# Patient Record
Sex: Female | Born: 1980 | Race: White | Hispanic: No | Marital: Married | Smoking: Never smoker
Health system: Southern US, Community
[De-identification: ages and names within clinical notes are randomized; demographics above are authoritative.]

## PROBLEM LIST (undated history)

## (undated) DIAGNOSIS — G43909 Migraine, unspecified, not intractable, without status migrainosus: Secondary | ICD-10-CM

## (undated) DIAGNOSIS — M51369 Other intervertebral disc degeneration, lumbar region without mention of lumbar back pain or lower extremity pain: Secondary | ICD-10-CM

## (undated) DIAGNOSIS — M5136 Other intervertebral disc degeneration, lumbar region: Secondary | ICD-10-CM

## (undated) DIAGNOSIS — F419 Anxiety disorder, unspecified: Secondary | ICD-10-CM

## (undated) DIAGNOSIS — F32A Depression, unspecified: Secondary | ICD-10-CM

## (undated) DIAGNOSIS — K589 Irritable bowel syndrome without diarrhea: Secondary | ICD-10-CM

## (undated) DIAGNOSIS — K219 Gastro-esophageal reflux disease without esophagitis: Secondary | ICD-10-CM

## (undated) DIAGNOSIS — G809 Cerebral palsy, unspecified: Secondary | ICD-10-CM

## (undated) DIAGNOSIS — M1990 Unspecified osteoarthritis, unspecified site: Secondary | ICD-10-CM

## (undated) DIAGNOSIS — Z8719 Personal history of other diseases of the digestive system: Secondary | ICD-10-CM

## (undated) DIAGNOSIS — G4700 Insomnia, unspecified: Secondary | ICD-10-CM

## (undated) HISTORY — DX: Other intervertebral disc degeneration, lumbar region: M51.36

## (undated) HISTORY — DX: Depression, unspecified: F32.A

## (undated) HISTORY — PX: LUMBAR FUSION: SHX111

## (undated) HISTORY — DX: Personal history of other diseases of the digestive system: Z87.19

## (undated) HISTORY — PX: BACK SURGERY: SHX140

## (undated) HISTORY — DX: Irritable bowel syndrome, unspecified: K58.9

## (undated) HISTORY — DX: Anxiety disorder, unspecified: F41.9

## (undated) HISTORY — PX: CORNEAL TRANSPLANT: SHX108

## (undated) HISTORY — DX: Unspecified osteoarthritis, unspecified site: M19.90

## (undated) HISTORY — DX: Other intervertebral disc degeneration, lumbar region without mention of lumbar back pain or lower extremity pain: M51.369

## (undated) HISTORY — DX: Gastro-esophageal reflux disease without esophagitis: K21.9

## (undated) HISTORY — DX: Migraine, unspecified, not intractable, without status migrainosus: G43.909

## (undated) HISTORY — PX: ABDOMINAL HYSTERECTOMY: SHX81

## (undated) HISTORY — DX: Insomnia, unspecified: G47.00

---

## 2018-01-07 HISTORY — PX: LAPAROSCOPIC OOPHERECTOMY: SHX6507

## 2018-10-08 HISTORY — PX: ABDOMINAL HYSTERECTOMY: SHX81

## 2019-07-11 ENCOUNTER — Emergency Department (HOSPITAL_COMMUNITY): Payer: Medicaid Other

## 2019-07-11 ENCOUNTER — Emergency Department (HOSPITAL_COMMUNITY)
Admission: EM | Admit: 2019-07-11 | Discharge: 2019-07-11 | Disposition: A | Discharge status: Home / Self Care | Payer: Medicaid Other | Attending: Emergency Medicine | Admitting: Emergency Medicine

## 2019-07-11 ENCOUNTER — Encounter (HOSPITAL_COMMUNITY): Payer: Self-pay | Admitting: Emergency Medicine

## 2019-07-11 DIAGNOSIS — M545 Low back pain: Secondary | ICD-10-CM | POA: Diagnosis present

## 2019-07-11 DIAGNOSIS — F17210 Nicotine dependence, cigarettes, uncomplicated: Secondary | ICD-10-CM | POA: Diagnosis not present

## 2019-07-11 DIAGNOSIS — M5136 Other intervertebral disc degeneration, lumbar region: Secondary | ICD-10-CM | POA: Insufficient documentation

## 2019-07-11 HISTORY — DX: Cerebral palsy, unspecified: G80.9

## 2019-07-11 MED ORDER — HYDROCODONE-ACETAMINOPHEN 5-325 MG PO TABS: 1.0000 | ORAL_TABLET | Freq: Four times a day (QID) | ORAL | 0 refills | Status: DC | PRN

## 2019-07-11 MED ORDER — HYDROCODONE-ACETAMINOPHEN 5-325 MG PO TABS
1.0000 | ORAL_TABLET | Freq: Once | ORAL | Status: AC
  Administered 2019-07-11: 1 via ORAL
  Filled 2019-07-11: qty 1

## 2019-07-11 MED ORDER — METHOCARBAMOL 500 MG PO TABS: 500.0000 mg | ORAL_TABLET | Freq: Two times a day (BID) | ORAL | 0 refills | Status: DC

## 2019-07-11 NOTE — ED Provider Notes (Signed)
[place] EMERGENCY DEPARTMENT Provider Note   CSN: [id number] Arrival date & time: 07/11/19  1417     History No chief complaint on file.   Peggy Harris is a 39 y.o. female with a past medical history of cerebral palsy presenting to the ED with a chief complaint of back pain.  States that yesterday she had a fall after feeling like her right leg became numb and "gave out on me."  States that she fell onto her extremities.  She has been having worsening back pain since then.  She remains ambulatory.  She does have a history of spinal fusion in her lumbar spine about 1-1/2 years ago and another similar surgery back in 2007.  She recently moved to [facility], is not currently in a pain management program.  She has been taking ibuprofen with only minimal improvement in her pain.  Denies any dysuria, hematuria, fever, chest pain, abdominal pain, loss of bowel or bladder function, history of cancer, history of IV drug use.  HPI     Past Medical History:  Diagnosis Date  . Cerebral palsy ([place])     There are no problems to display for this patient.   Past Surgical History:  Procedure Laterality Date  . ABDOMINAL HYSTERECTOMY    . BACK SURGERY    . CESAREAN SECTION    . CORNEAL TRANSPLANT       OB History   No obstetric history on file.     No family history on file.  Social History   Tobacco Use  . Smoking status: Current Every Day Smoker  . Smokeless tobacco: Never Used  Vaping Use  . Vaping Use: Every day  Substance Use Topics  . Alcohol use: Yes  . Drug use: Never    Home Medications Prior to Admission medications   Medication Sig Start Date End Date Taking? Authorizing Provider  HYDROcodone-acetaminophen (NORCO/VICODIN) 5-325 MG tablet Take 1 tablet by mouth every 6 (six) hours as needed. 07/11/19   Caliber Landess, PA-C  methocarbamol (ROBAXIN) 500 MG tablet Take 1 tablet (500 mg total) by mouth 2 (two) times daily. 07/11/19   Delia Heady, PA-C    Allergies    Patient has no allergy information on record.  Review of Systems   Review of Systems  Constitutional: Negative for appetite change, chills and fever.  HENT: Negative for ear pain, rhinorrhea, sneezing and sore throat.   Eyes: Negative for photophobia and visual disturbance.  Respiratory: Negative for cough, chest tightness, shortness of breath and wheezing.   Cardiovascular: Negative for chest pain and palpitations.  Gastrointestinal: Negative for abdominal pain, blood in stool, constipation, diarrhea, nausea and vomiting.  Genitourinary: Negative for dysuria, hematuria and urgency.  Musculoskeletal: Positive for back pain. Negative for myalgias.  Skin: Negative for rash.  Neurological: Negative for dizziness, weakness and light-headedness.    Physical Exam Updated Vital Signs BP 118/61 (BP Location: Right Arm)   Pulse 96   Temp 98.6 F (37 C) (Oral)   Resp 14   Ht 5\' 10"  (1.778 m)   Wt 63.5 kg   SpO2 99%   BMI 20.09 kg/m   Physical Exam Vitals and nursing note reviewed.  Constitutional:      General: She is not in acute distress.    Appearance: She is well-developed.  HENT:     Head: Normocephalic and atraumatic.     Nose: Nose normal.  Eyes:     General: No scleral icterus.  Right eye: No discharge.        Left eye: No discharge.     Conjunctiva/sclera: Conjunctivae normal.  Cardiovascular:     Rate and Rhythm: Normal rate and regular rhythm.     Heart sounds: Normal heart sounds. No murmur heard.  No friction rub. No gallop.   Pulmonary:     Effort: Pulmonary effort is normal. No respiratory distress.     Breath sounds: Normal breath sounds.  Abdominal:     General: Bowel sounds are normal. There is no distension.     Palpations: Abdomen is soft.     Tenderness: There is no abdominal tenderness. There is no guarding.  Musculoskeletal:        General: Normal range of motion.     Cervical back: Normal range of motion and neck  supple.     Comments: Tenderness palpation of the lumbar spine at the midline paraspinal musculature.  No midline spinal tenderness present in lumbar, thoracic or cervical spine. No step-off palpated. No visible bruising, edema or temperature change noted. No objective signs of numbness present. No saddle anesthesia. 2+ DP pulses bilaterally. Sensation intact to light touch.  Slightly decreased strength of the right lower extremity.  Skin:    General: Skin is warm and dry.     Findings: No rash.  Neurological:     Mental Status: She is alert.     Motor: No abnormal muscle tone.     Coordination: Coordination normal.     ED Results / Procedures / Treatments   Labs (all labs ordered are listed, but only abnormal results are displayed) Labs Reviewed - No data to display  EKG None  Radiology MR LUMBAR SPINE WO CONTRAST  Result Date: 07/11/2019 CLINICAL DATA:  Low back pain, right leg numbness and weakness, history of spinal fusion. EXAM: MRI LUMBAR SPINE WITHOUT CONTRAST TECHNIQUE: Multiplanar, multisequence MR imaging of the lumbar spine was performed. No intravenous contrast was administered. COMPARISON:  No pertinent prior studies available for comparison. FINDINGS: Segmentation:  5 lumbar vertebrae are assumed. Alignment:  Trace L3-L4 grade 1 retrolisthesis. Vertebrae: Sequela of prior multilevel posterior decompression with posterior spinal fusion construct spanning L4-S1. Susceptibility artifact from spinal fusion hardware limits evaluation of marrow signal. Within this limitation, no significant marrow edema or suspicious osseous lesion is identified. Conus medullaris and cauda equina: Conus extends to the L2 level. No signal abnormality within the visualized distal spinal cord. Paraspinal and other soft tissues: No abnormality identified within included portions of the abdomen/retroperitoneum. Postsurgical changes to the lower lumbar dorsal soft tissues. Disc levels: Mild disc height loss  at T11-T12 and L3-L4. T12-L1: This level is imaged sagittally. No significant disc herniation or stenosis. L1-L2: No significant disc herniation or stenosis. L2-L3: No significant disc herniation or stenosis. L3-L4: Mild grade 1 retrolisthesis. Small disc bulge. Mild facet arthrosis/ligamentum flavum hypertrophy. Mild bilateral subarticular narrowing without frank nerve root impingement. Central canal patent. Mild right neural foraminal narrowing. L4-L5: Sequela of prior posterior decompression and posterior spinal fusion. Susceptibility artifact from spinal fusion hardware significantly limits evaluation of the ventral spinal canal. Disc osteophyte ridge without definite disc herniation. Posterior element hypertrophy. No appreciable high-grade spinal canal stenosis. Susceptibility artifact also limits evaluation of the neural foramina. There is suspected at least mild/moderate left neural foraminal narrowing at this level (series 2, image 11). L5-S1: Sequela of previous posterior decompression and posterior spinal fusion. No stenosis. IMPRESSION: Sequela of prior multilevel posterior decompression with posterior spinal fusion construct spanning the  L4-S1 levels. Lumbar spondylosis as outlined and most notably as follows. At L3-L4, there is trace grade 1 retrolisthesis. Mild disc height loss with a small disc bulge. Mild facet arthrosis/ligamentum flavum hypertrophy. Mild bilateral subarticular narrowing without frank nerve root impingement. Central canal patent. Mild right neural foraminal narrowing. At L4-L5, there is sequela of prior posterior decompression and posterior spinal fusion. Susceptibility artifact from spinal fusion hardware significantly limits evaluation of the ventral spinal canal. Disc osteophyte ridge without definite disc herniation. Posterior element hypertrophy. No appreciable high-grade spinal canal stenosis. Susceptibility artifact also limits evaluation of the neural foramina. There is  suspected at least mild/moderate left neural foraminal narrowing at this level. Electronically Signed   By: Kellie Simmering DO   On: 07/11/2019 17:59    Procedures Procedures (including critical care time)  Medications Ordered in ED Medications  HYDROcodone-acetaminophen (NORCO/VICODIN) 5-325 MG per tablet 1 tablet (1 tablet Oral Given 07/11/19 1742)    ED Course  I have reviewed the triage vital signs and the nursing notes.  Pertinent labs & imaging results that were available during my care of the patient were reviewed by me and considered in my medical decision making (see chart for details).    MDM Rules/Calculators/A&P                          39 year old female with past medical history of cerebral palsy presenting to the ED with a chief complaint of back pain.  Had a fall after feeling like her right leg became numb and "gave out on me" yesterday.  She has been having worsening back pain since then.  Reports history of spinal fusion 2, most recent being 1-1/2 years ago.  Patient recently moved to [place] from [place], has not yet established with a specialist, primary care provider or pain management clinic.  She has been taking ibuprofen with only minimal improvement in her symptoms.  Denies any fever, hematuria, dysuria, loss of bowel or bladder function, history of cancer or history of IV drug use.  On exam patient has tenderness palpation of the lower lumbar spine at the midline paraspinal musculature.  She has some decreased strength of the right lower extremity she reports secondary to pain.  Normal sensation noted.  No objective signs of numbness.  She has no abdominal tenderness.  She is afebrile without recent use of antipyretics.  MRI obtained here of the lumbar spine without contrast showing no acute findings, no evidence of cauda equina or infectious process.  Patient's pain controlled here in the ED she remains ambulatory.  Suspect her symptoms are due to her chronic  impingement and degenerative changes.  We will have her follow-up with a spine specialist, establish care with a PCP.  Will give short course of pain medication and muscle relaxer.  I have placed a TOC consult.   Patient is hemodynamically stable, in NAD, and able to ambulate in the ED. Evaluation does not show pathology that would require ongoing emergent intervention or inpatient treatment. I explained the diagnosis to the patient. Pain has been managed and has no complaints prior to discharge. Patient is comfortable with above plan and is stable for discharge at this time. All questions were answered prior to disposition. Strict return precautions for returning to the ED were discussed. Encouraged follow up with PCP.   Prior to providing a prescription for a controlled substance, I independently reviewed the patient's recent prescription history on the [place] Controlled Substance  Reporting System. The patient had no recent or regular prescriptions and was deemed appropriate for a brief, less than 3 day prescription of narcotic for acute analgesia.  An After Visit Summary was printed and given to the patient.   Portions of this note were generated with [employer]. Dictation errors may occur despite best attempts at proofreading.  Final Clinical Impression(s) / ED Diagnoses Final diagnoses:  Degenerative disc disease, lumbar    Rx / DC Orders ED Discharge Orders         Ordered    HYDROcodone-acetaminophen (NORCO/VICODIN) 5-325 MG tablet  Every 6 hours PRN     Discontinue  Reprint     07/11/19 1825    methocarbamol (ROBAXIN) 500 MG tablet  2 times daily     Discontinue  Reprint     07/11/19 1825           Delia Heady, PA-C 07/11/19 1841    Drenda Freeze, MD 07/13/19 [phone]

## 2019-07-11 NOTE — ED Triage Notes (Signed)
C/o lower back pain and R leg pain.  Reports 2 episodes of R leg being numb and "giving out" yesterday causing her to fall.  Denies numbness at present.

## 2019-07-11 NOTE — ED Notes (Signed)
Ambulated pt from bed to commode, limps on right leg, painful with any movement. Is able to get self on stretcher with minimal assistance

## 2019-07-11 NOTE — Discharge Instructions (Addendum)
It is important for you to establish care with a primary care provider.  You can also see the specialist listed below. Take the medications as needed to help with your symptoms. Return to the ER for worsening pain, losing control of your bowels or bladder, chest pain, shortness of breath, additional injuries or falls.

## 2019-07-28 ENCOUNTER — Encounter: Payer: Self-pay | Admitting: Internal Medicine

## 2019-07-28 ENCOUNTER — Ambulatory Visit: Payer: Self-pay | Attending: Family Medicine | Admitting: Internal Medicine

## 2019-07-28 ENCOUNTER — Other Ambulatory Visit: Payer: Self-pay

## 2019-07-28 DIAGNOSIS — G4700 Insomnia, unspecified: Secondary | ICD-10-CM | POA: Insufficient documentation

## 2019-07-28 DIAGNOSIS — F5104 Psychophysiologic insomnia: Secondary | ICD-10-CM

## 2019-07-28 DIAGNOSIS — M545 Low back pain, unspecified: Secondary | ICD-10-CM

## 2019-07-28 DIAGNOSIS — M549 Dorsalgia, unspecified: Secondary | ICD-10-CM | POA: Insufficient documentation

## 2019-07-28 DIAGNOSIS — G8929 Other chronic pain: Secondary | ICD-10-CM

## 2019-07-28 HISTORY — DX: Insomnia, unspecified: G47.00

## 2019-07-28 MED ORDER — NAPROXEN 500 MG PO TABS
500.0000 mg | ORAL_TABLET | Freq: Two times a day (BID) | ORAL | 0 refills | Status: DC
Start: 2019-07-28 — End: 2020-04-18

## 2019-07-28 MED ORDER — GABAPENTIN 300 MG PO CAPS
ORAL_CAPSULE | ORAL | 3 refills | Status: DC
Start: 2019-07-28 — End: 2020-04-18

## 2019-07-28 NOTE — Assessment & Plan Note (Signed)
Ongoing insomnia I have asked her to change gabapentin to 300 qam and 900 qhs

## 2019-07-28 NOTE — Progress Notes (Signed)
F/u ED visit. Reviewed note from ED . Reviewed MRI performed in ED  She has had 2 back surgeries (2007 and 2019). One in [place] , the other in [place].   She continues to have back pain. She has daily back pain that dates back prior to the 2019 surgery. Back pain is located in lower and mid-back. Pain seems to radiate to leg and sometimes to right arm.   She complains of right leg giving her "more trouble". She describes some pain in right hip and right ankle. She thinks that her right ankle may even be swollen at times and it aches at night or if she is on her feet for long periods of time.  She has seen pain specialist in [place].   shx- lives with husband and 3 kids- ages 46-18  Exam.   Well-developed well-nourished female in no acute distress. HEENT exam atraumatic, normocephalic, extraocular muscles are intact. Neck is supple. No jugular venous distention no thyromegaly. Chest clear to auscultation without increased work of breathing. Cardiac exam S1 and S2 are regular. Abdominal exam active bowel sounds, soft, nontender. Extremities no edema. Neurologic exam she is alert  Gait is normal. She has midline lumbar scar. Tenderness even to palpation of dermis over the lumbar spine. She also has diffuse tenderness over righ hip and entire right leg to the ankle.  DTRs at knees and ankles are symmetric and brisk   Back pain Chronic back pain- MRI and clinical findings without urgent concern.  Will try naproxen and refer to PT.   She will establish with MD  Insomnia disorder Ongoing insomnia I have asked her to change gabapentin to 300 qam and 900 qhs

## 2019-07-28 NOTE — Assessment & Plan Note (Signed)
Chronic back pain- MRI and clinical findings without urgent concern.  Will try naproxen and refer to PT.   She will establish with MD

## 2019-08-12 ENCOUNTER — Ambulatory Visit: Payer: Self-pay

## 2019-08-24 ENCOUNTER — Ambulatory Visit: Payer: Self-pay | Admitting: Family Medicine

## 2019-09-02 ENCOUNTER — Ambulatory Visit (HOSPITAL_COMMUNITY): Payer: No Payment, Other | Admitting: Clinical

## 2019-10-04 ENCOUNTER — Ambulatory Visit (HOSPITAL_COMMUNITY): Payer: No Payment, Other | Admitting: Psychiatry

## 2020-01-07 ENCOUNTER — Encounter (HOSPITAL_COMMUNITY): Payer: Self-pay | Admitting: Emergency Medicine

## 2020-01-07 ENCOUNTER — Emergency Department (HOSPITAL_COMMUNITY)
Admission: EM | Admit: 2020-01-07 | Discharge: 2020-01-07 | Disposition: A | Discharge status: Home / Self Care | Payer: Medicaid Other | Attending: Emergency Medicine | Admitting: Emergency Medicine

## 2020-01-07 ENCOUNTER — Other Ambulatory Visit: Payer: Self-pay

## 2020-01-07 DIAGNOSIS — Z20822 Contact with and (suspected) exposure to covid-19: Secondary | ICD-10-CM | POA: Diagnosis not present

## 2020-01-07 DIAGNOSIS — F17200 Nicotine dependence, unspecified, uncomplicated: Secondary | ICD-10-CM | POA: Diagnosis not present

## 2020-01-07 DIAGNOSIS — M5416 Radiculopathy, lumbar region: Secondary | ICD-10-CM | POA: Insufficient documentation

## 2020-01-07 DIAGNOSIS — M545 Low back pain, unspecified: Secondary | ICD-10-CM | POA: Diagnosis present

## 2020-01-07 LAB — SARS CORONAVIRUS 2 (TAT 6-24 HRS): SARS Coronavirus 2: NEGATIVE

## 2020-01-07 MED ORDER — PREDNISONE 20 MG PO TABS
60.0000 mg | ORAL_TABLET | Freq: Once | ORAL | Status: AC
  Administered 2020-01-07: 60 mg via ORAL
  Filled 2020-01-07: qty 3

## 2020-01-07 MED ORDER — PREDNISONE 20 MG PO TABS
20.0000 mg | ORAL_TABLET | Freq: Two times a day (BID) | ORAL | 0 refills | Status: DC
Start: 2020-01-07 — End: 2020-04-18

## 2020-01-07 NOTE — ED Triage Notes (Signed)
Pt coming from home. Complaint of a fall approx. 10 days ago. Patient states she has had two spinal surgeries in past. Ever since falling increased pain and tingling in legs. VSS. NAD.

## 2020-01-07 NOTE — ED Provider Notes (Signed)
[facility] EMERGENCY DEPARTMENT Provider Note   CSN: [id number] Arrival date & time: 01/07/20  1146     History Chief Complaint  Patient presents with  . Fall    Peggy Harris is a 39 y.o. female.  HPI She presents for evaluation of a sensation of numbness and tingling in her right leg, present for 2 weeks since a fall.  She states that she fell because of a balance problem a couple weeks ago and injured her head.  She had mild headache for a while that improved after a day or 2.  She states that she has chronic ongoing numbness in her right leg but now it is "100 times worse."  She denies decreased sensation in the perineum, urinary incontinence, bowel incontinence, weakness or dizziness.  She has chronic lower back symptoms following surgery, and follows with a primary care physician for that.  She takes narcotics chronically, and frequently takes benzodiazepine medication, on a chronic basis also.  She states that she was exposed to Covid recently at work, and wants to have a Covid test done.  She denies shortness of breath, cough or fever.  She has not had Covid vaccines.  There are no other known modifying factors.    Past Medical History:  Diagnosis Date  . Cerebral palsy [facility])     Patient Active Problem List   Diagnosis Date Noted  . Back pain 07/28/2019  . Insomnia disorder 07/28/2019    Past Surgical History:  Procedure Laterality Date  . ABDOMINAL HYSTERECTOMY    . BACK SURGERY    . CESAREAN SECTION    . CORNEAL TRANSPLANT       OB History   No obstetric history on file.     No family history on file.  Social History   Tobacco Use  . Smoking status: Current Every Day Smoker  . Smokeless tobacco: Never Used  Vaping Use  . Vaping Use: Every day  Substance Use Topics  . Alcohol use: Yes  . Drug use: Never    Home Medications Prior to Admission medications   Medication Sig Start Date End Date Taking? Authorizing Provider   predniSONE (DELTASONE) 20 MG tablet Take 1 tablet (20 mg total) by mouth 2 (two) times daily. 01/07/20  Yes Mancel Bale, MD  gabapentin (NEURONTIN) 300 MG capsule Take 300 mg by mouth in the morning.    [provider]  gabapentin (NEURONTIN) 300 MG capsule 3 capsules at bedtime. 07/28/19   Hoy Register, MD  methocarbamol (ROBAXIN) 500 MG tablet Take 1 tablet (500 mg total) by mouth 2 (two) times daily. 07/11/19   Khatri, Hina, PA-C  naproxen (NAPROSYN) 500 MG tablet Take 1 tablet (500 mg total) by mouth 2 (two) times daily with a meal. 07/28/19   Hoy Register, MD    Allergies    Patient has no known allergies.  Review of Systems   Review of Systems  All other systems reviewed and are negative.   Physical Exam Updated Vital Signs BP (!) 124/98 (BP Location: Left Arm)   Pulse 87   Temp 98.3 F (36.8 C) (Oral)   Resp 17   Ht 5\' 7"  (1.702 m)   Wt 72.6 kg   SpO2 98%   BMI 25.06 kg/m   Physical Exam Vitals and nursing note reviewed.  Constitutional:      Appearance: She is well-developed and well-nourished.  HENT:     Head: Normocephalic and atraumatic.     Right  Ear: External ear normal.     Left Ear: External ear normal.  Eyes:     Extraocular Movements: EOM normal.     Conjunctiva/sclera: Conjunctivae normal.     Pupils: Pupils are equal, round, and reactive to light.  Neck:     Trachea: Phonation normal.  Cardiovascular:     Rate and Rhythm: Normal rate and regular rhythm.     Heart sounds: Normal heart sounds.  Pulmonary:     Effort: Pulmonary effort is normal.     Breath sounds: Normal breath sounds.  Chest:     Chest wall: No bony tenderness.  Abdominal:     Palpations: Abdomen is soft.     Tenderness: There is no abdominal tenderness.  Musculoskeletal:        General: Normal range of motion.     Cervical back: Normal range of motion and neck supple.  Skin:    General: Skin is warm, dry and intact.  Neurological:     Mental Status: She is  alert and oriented to person, place, and time.     Cranial Nerves: No cranial nerve deficit.     Sensory: No sensory deficit.     Motor: No abnormal muscle tone.     Coordination: Coordination normal.  Psychiatric:        Mood and Affect: Mood and affect normal.        Behavior: Behavior normal.        Thought Content: Thought content normal.        Judgment: Judgment normal.     ED Results / Procedures / Treatments   Labs (all labs ordered are listed, but only abnormal results are displayed) Labs Reviewed  SARS CORONAVIRUS 2 (TAT 6-24 HRS)    EKG None  Radiology No results found.  Procedures Procedures (including critical care time)  Medications Ordered in ED Medications  predniSONE (DELTASONE) tablet 60 mg (60 mg Oral Given 01/07/20 1829)    ED Course  I have reviewed the triage vital signs and the nursing notes.  Pertinent labs & imaging results that were available during my care of the patient were reviewed by me and considered in my medical decision making (see chart for details).    MDM Rules/Calculators/A&P                           Patient Vitals for the past 24 hrs:  BP Temp Temp src Pulse Resp SpO2 Height Weight  01/07/20 1753 (!) 124/98 98.3 F (36.8 C) Oral 87 17 98 % -- --  01/07/20 1454 131/82 -- -- 82 18 100 % -- --  01/07/20 1209 (!) 126/93 98.4 F (36.9 C) Oral 97 18 100 % 5\' 7"  (1.702 m) 72.6 kg    6:33 PM Reevaluation with update and discussion. After initial assessment and treatment, an updated evaluation reveals no change in clinical status, findings discussed with patient all questions answered. Daleen Bo   Medical Decision Making:  This patient is presenting for evaluation of numbness right leg, ongoing, worse since fall 2 weeks ago, which does not require a range of treatment options, and is not a complaint that involves a moderate risk of morbidity and mortality. The differential diagnoses include fracture, contusion, lumbar  radiculopathy, lumbar myelopathy. I decided to review old records, and in summary patient with ongoing chronic pain, using narcotics daily, presenting with worsening numbness of the right leg, and incidental request for Covid vaccine.  I  did not require additional historical information from anyone.   Critical Interventions-joint evaluation, medication treatment, Covid swab done, discussion with patient  After These Interventions, the Patient was reevaluated and was found stable for discharge.  Ongoing lumbar radiculopathy, without significant change in status.  Doubt lumbar myelopathy, fracture, intracranial injury.  CRITICAL CARE-no Performed by: Daleen Bo  Nursing Notes Reviewed/ Care Coordinated Applicable Imaging Reviewed Interpretation of Laboratory Data incorporated into ED treatment  The patient appears reasonably screened and/or stabilized for discharge and I doubt any other medical condition or other [facility] requiring further screening, evaluation, or treatment in the ED at this time prior to discharge.  Plan: Home Medications-continue usual; Home Treatments-rest, fluids; return here if the recommended treatment, does not improve the symptoms; Recommended follow up-PCP follow-up as needed.     Final Clinical Impression(s) / ED Diagnoses Final diagnoses:  Lumbar radiculopathy    Rx / DC Orders ED Discharge Orders         Ordered    predniSONE (DELTASONE) 20 MG tablet  2 times daily        01/07/20 1833           Daleen Bo, MD 01/07/20 [phone]

## 2020-01-07 NOTE — ED Notes (Signed)
Pt asked if EMT could roll her outside for some fresh air. Pt sitting near ED dropoff entrance.

## 2020-01-07 NOTE — Discharge Instructions (Addendum)
Follow-up with your treating physician for ongoing management of your discomfort.

## 2020-01-07 NOTE — ED Notes (Signed)
Reviewed discharge instructions with patient. Follow-up care and medications reviewed. Patient verbalized understanding. Patient A&Ox4, VSS upon discharge. 

## 2020-03-09 ENCOUNTER — Encounter: Payer: Self-pay | Admitting: Gastroenterology

## 2020-03-29 ENCOUNTER — Emergency Department (HOSPITAL_COMMUNITY)
Admission: EM | Admit: 2020-03-29 | Discharge: 2020-03-30 | Disposition: A | Discharge status: Home / Self Care | Payer: Medicaid Other | Attending: Emergency Medicine | Admitting: Emergency Medicine

## 2020-03-29 ENCOUNTER — Other Ambulatory Visit: Payer: Self-pay

## 2020-03-29 ENCOUNTER — Emergency Department (HOSPITAL_COMMUNITY): Payer: Medicaid Other

## 2020-03-29 ENCOUNTER — Encounter (HOSPITAL_COMMUNITY): Payer: Self-pay

## 2020-03-29 DIAGNOSIS — R600 Localized edema: Secondary | ICD-10-CM | POA: Insufficient documentation

## 2020-03-29 DIAGNOSIS — R0602 Shortness of breath: Secondary | ICD-10-CM | POA: Diagnosis not present

## 2020-03-29 DIAGNOSIS — M7989 Other specified soft tissue disorders: Secondary | ICD-10-CM | POA: Diagnosis not present

## 2020-03-29 DIAGNOSIS — R791 Abnormal coagulation profile: Secondary | ICD-10-CM | POA: Diagnosis not present

## 2020-03-29 DIAGNOSIS — F17200 Nicotine dependence, unspecified, uncomplicated: Secondary | ICD-10-CM | POA: Insufficient documentation

## 2020-03-29 DIAGNOSIS — M79662 Pain in left lower leg: Secondary | ICD-10-CM | POA: Diagnosis not present

## 2020-03-29 LAB — BASIC METABOLIC PANEL
Anion gap: 5 (ref 5–15)
BUN: 17 mg/dL (ref 6–20)
CO2: 26 mmol/L (ref 22–32)
Calcium: 8.9 mg/dL (ref 8.9–10.3)
Chloride: 106 mmol/L (ref 98–111)
Creatinine, Ser: 0.61 mg/dL (ref 0.44–1.00)
GFR, Estimated: >60 mL/min (ref >60)
Glucose, Bld: 122 mg/dL — ABNORMAL HIGH (ref 70–99)
Potassium: 4.4 mmol/L (ref 3.5–5.1)
Sodium: 137 mmol/L (ref 135–145)

## 2020-03-29 LAB — CBC
HCT: 38.1 % (ref 36.0–46.0)
Hemoglobin: 12.3 g/dL (ref 12.0–15.0)
MCH: 31.1 pg (ref 26.0–34.0)
MCHC: 32.3 g/dL (ref 30.0–36.0)
MCV: 96.5 fL (ref 80.0–100.0)
Platelets: 176 10*3/uL (ref 150–400)
RBC: 3.95 MIL/uL (ref 3.87–5.11)
RDW: 12.4 % (ref 11.5–15.5)
WBC: 6.5 10*3/uL (ref 4.0–10.5)
nRBC: 0 % (ref 0.0–0.2)

## 2020-03-29 LAB — D-DIMER, QUANTITATIVE: D-Dimer, Quant: 0.78 ug/mL-FEU — ABNORMAL HIGH (ref 0.00–0.50)

## 2020-03-29 LAB — TROPONIN I (HIGH SENSITIVITY)
Troponin I (High Sensitivity): 3 ng/L (ref <18)
Troponin I (High Sensitivity): 3 ng/L (ref <18)

## 2020-03-29 LAB — I-STAT BETA HCG BLOOD, ED (MC, WL, AP ONLY): I-stat hCG, quantitative: <5 m[IU]/mL (ref <5)

## 2020-03-29 LAB — BRAIN NATRIURETIC PEPTIDE: B Natriuretic Peptide: 39 pg/mL (ref 0.0–100.0)

## 2020-03-29 MED ORDER — HYDROCODONE-ACETAMINOPHEN 5-325 MG PO TABS
2.0000 | ORAL_TABLET | Freq: Once | ORAL | Status: AC
Start: 2020-03-29 — End: 2020-03-29
  Administered 2020-03-29: 2 via ORAL
  Filled 2020-03-29: qty 2

## 2020-03-29 NOTE — ED Provider Notes (Addendum)
[facility] EMERGENCY DEPARTMENT Provider Note   CSN: [id number] Arrival date & time: 03/29/20  1834     History No chief complaint on file.   Peggy Harris is a 40 y.o. female who presents with concern for 7 days of left lower extremity unilateral swelling with associated pain.  Patient was started on fluid pills by her primary care doctor, however this did not relieve her symptoms.  Family history of DVT.  Patient is not on any hormone replacement therapy, denies any recent surgical procedures, travel, prolonged immobilization, or history of malignancy.  Patient endorses several months of shortness of breath that has increased proportion to her weight gain.  Worse with activity.  She denies any chest pain, palpitations, nausea vomiting, diarrhea, fevers, chills at home.  She denies any numbness, tingling, weakness in her lower extremity, denies any saddle anesthesia, urinary incontinence or urinary retention.  I personally read the patient medical records.  She is history of cerebral palsy with multiple spinal surgeries; of note she is awaiting surgical repair for displaced spinal hardware that is now impinging on her spinal cord.  History of corneal transplant, abdominal hysterectomy.  She does have a history of cardiomyopathy in pregnancy, but has not had cardiac concern since her most recent pregnancy.    HPI     Past Medical History:  Diagnosis Date   Cerebral palsy [facility])     Patient Active Problem List   Diagnosis Date Noted   Back pain 07/28/2019   Insomnia disorder 07/28/2019    Past Surgical History:  Procedure Laterality Date   ABDOMINAL HYSTERECTOMY     BACK SURGERY     CESAREAN SECTION     CORNEAL TRANSPLANT       OB History   No obstetric history on file.     No family history on file.  Social History   Tobacco Use   Smoking status: Current Every Day Smoker   Smokeless tobacco: Never Used  Vaping Use   Vaping Use:  Every day  Substance Use Topics   Alcohol use: Yes   Drug use: Never    Home Medications Prior to Admission medications   Medication Sig Start Date End Date Taking? Authorizing Provider  gabapentin (NEURONTIN) 300 MG capsule Take 300 mg by mouth in the morning.    [provider]  gabapentin (NEURONTIN) 300 MG capsule 3 capsules at bedtime. 07/28/19   Charlott Rakes, MD  methocarbamol (ROBAXIN) 500 MG tablet Take 1 tablet (500 mg total) by mouth 2 (two) times daily. 07/11/19   Khatri, Hina, PA-C  naproxen (NAPROSYN) 500 MG tablet Take 1 tablet (500 mg total) by mouth 2 (two) times daily with a meal. 07/28/19   Charlott Rakes, MD  predniSONE (DELTASONE) 20 MG tablet Take 1 tablet (20 mg total) by mouth 2 (two) times daily. 01/07/20   Daleen Bo, MD    Allergies    Patient has no known allergies.  Review of Systems   Review of Systems  Constitutional: Negative.   HENT: Negative.   Respiratory: Positive for chest tightness and shortness of breath. Negative for cough and wheezing.   Cardiovascular: Positive for leg swelling. Negative for chest pain and palpitations.  Gastrointestinal: Negative.   Genitourinary: Negative.   Musculoskeletal: Negative.   Neurological: Negative.   Hematological: Negative.     Physical Exam Updated Vital Signs BP 111/78 (BP Location: Left Arm)    Pulse 87    Temp 98.4 F (36.9 C) (Oral)  Resp 20    SpO2 99%   Physical Exam Vitals and nursing note reviewed.  HENT:     Head: Normocephalic and atraumatic.     Nose: Nose normal.     Mouth/Throat:     Mouth: Mucous membranes are moist.     Pharynx: Oropharynx is clear. Uvula midline. No oropharyngeal exudate or posterior oropharyngeal erythema.     Tonsils: No tonsillar exudate.  Eyes:     General: Lids are normal. Vision grossly intact.        Right eye: No discharge.        Left eye: No discharge.     Extraocular Movements: Extraocular movements intact.     Conjunctiva/sclera:  Conjunctivae normal.     Pupils: Pupils are equal, round, and reactive to light.  Neck:     Trachea: Trachea and phonation normal.  Cardiovascular:     Rate and Rhythm: Normal rate and regular rhythm.     Pulses: Normal pulses.          Dorsalis pedis pulses are 2+ on the right side and 2+ on the left side.     Heart sounds: Normal heart sounds.  Pulmonary:     [place]: Pulmonary effort is normal. No tachypnea, accessory muscle usage or respiratory distress.     Breath sounds: Normal breath sounds. No decreased breath sounds, wheezing, rhonchi or rales.  Chest:     Chest wall: No lacerations, deformity, swelling, tenderness, crepitus or edema.  Abdominal:     General: Bowel sounds are normal. There is no distension.     Palpations: Abdomen is soft.     Tenderness: There is no abdominal tenderness. There is no guarding or rebound.  Musculoskeletal:        General: Tenderness present. No deformity.     Cervical back: Normal range of motion and neck supple. No rigidity or crepitus. No pain with movement or spinous process tenderness.     Right lower leg: Normal. No edema.     Left lower leg: Tenderness present. No bony tenderness. 1+ Edema present.     Comments: Unilateral left calf swelling and tenderness to palpation, tenderness palpation of the medial thigh on the left as well.  No erythema, crepitus, or skin changes in the left leg.  Lymphadenopathy:     Cervical: No cervical adenopathy.  Skin:    General: Skin is warm and dry.     Capillary Refill: Capillary refill takes less than 2 seconds.  Neurological:     General: No focal deficit present.     Mental Status: She is alert and oriented to person, place, and time. Mental status is at baseline.     Sensory: Sensation is intact.     Motor: Motor function is intact.  Psychiatric:        Mood and Affect: Mood normal.     ED Results / Procedures / Treatments   Labs (all labs ordered are listed, but only abnormal results are  displayed) Labs Reviewed  BASIC METABOLIC PANEL - Abnormal; Notable for the following components:      Result Value   Glucose, Bld 122 (*)    All other components within normal limits  CBC  D-DIMER, QUANTITATIVE  BRAIN NATRIURETIC PEPTIDE  I-STAT BETA HCG BLOOD, ED ([facility], [facility], [facility] ONLY)  TROPONIN I (HIGH SENSITIVITY)  TROPONIN I (HIGH SENSITIVITY)    EKG None EKG: normal sinus rhythm, no STEMI.  Radiology DG Chest 2 View  Result Date: 03/29/2020 CLINICAL  DATA:  Short of breath, chest pain, left lower extremity swelling for 5 days, tobacco abuse EXAM: CHEST - 2 VIEW COMPARISON:  None. FINDINGS: Frontal and lateral views of the chest demonstrate an unremarkable cardiac silhouette. No acute airspace disease, effusion, or pneumothorax. Diffuse interstitial prominence consistent with history of tobacco abuse. No acute bony abnormalities. IMPRESSION: 1. No acute intrathoracic process. Electronically Signed   By: Randa Ngo M.D.   On: 03/29/2020 19:49    Procedures Procedures   Medications Ordered in ED Medications  HYDROcodone-acetaminophen (NORCO/VICODIN) 5-325 MG per tablet 2 tablet (has no administration in time range)    ED Course  I have reviewed the triage vital signs and the nursing notes.  Pertinent labs & imaging results that were available during my care of the patient were reviewed by me and considered in my medical decision making (see chart for details).    MDM Rules/Calculators/A&P                         40 year old female presents with concern for unilateral left lower extremity swelling x1 week, not relieved by diuretics as prescribed by PCP.  Presents normal intake.  Cardiopulmonary exam is normal, abdominal exam is benign.  There is unilateral left lower extremity edema relative to the right, with associated left calf soreness palpation and tenderness palpation of the left medial thigh without skin changes to suggest infection.  Patient denies any recent  trauma.  CBC is unremarkable, BMP is unremarkable, initial troponin is negative, 3.  Patient is not pregnant.  Chest x-ray negative for acute intrathoracic abnormality.  EKG NSR no STEMI.   Will proceed with lower extremity Doppler at this time to rule out DVT.  Unfortunately patient presented the emergency department outside of vascular ultrasound business hours.  Patient will need to return to the emergency department tomorrow for Doppler study of her lower extremity.  Additionally patient's D-dimer was elevated to 0.78.  For this reason a CT angiogram was ordered to rule out PE.    Care of this patient signed out to oncoming ED provider, Quincy Carnes, PA-C at time of shift change.  All pertinent HPI, physical exam, laboratory, and imaging studies were reviewed with her prior to my departure.  I appreciate her collaboration of care of this patient.  Peggy Harris voiced understanding of her medical evaluation and treatment plan.  Each of her questions was answered to her expressed satisfaction.  Disposition pending CTA of the chest.  This chart was dictated using voice recognition software, Dragon. Despite the best efforts of this provider to proofread and correct errors, errors may still occur which can change documentation meaning.   Final Clinical Impression(s) / ED Diagnoses Final diagnoses:  None    Rx / DC Orders ED Discharge Orders    None       Emeline Darling, PA-C 03/30/20 0016    Peggy Harris, Gypsy Balsam, PA-C 03/30/20 0034    Lorelle Gibbs, DO 03/30/20 1622

## 2020-03-29 NOTE — ED Triage Notes (Signed)
Patient complains of SOB with left leg/ankle swelling x 1 week. Patient denies cp. Patient complains of SOB with exertion and at rest. Speaking complete sentences, NAD

## 2020-03-30 ENCOUNTER — Encounter (HOSPITAL_COMMUNITY): Payer: Medicaid Other

## 2020-03-30 ENCOUNTER — Ambulatory Visit (HOSPITAL_COMMUNITY)
Admission: RE | Admit: 2020-03-30 | Discharge: 2020-03-30 | Disposition: A | Discharge status: Home / Self Care | Payer: Medicaid Other | Source: Ambulatory Visit | Attending: Student | Admitting: Student

## 2020-03-30 ENCOUNTER — Other Ambulatory Visit (HOSPITAL_COMMUNITY): Payer: Self-pay | Admitting: *Deleted

## 2020-03-30 ENCOUNTER — Emergency Department (HOSPITAL_COMMUNITY): Payer: Medicaid Other

## 2020-03-30 DIAGNOSIS — M79605 Pain in left leg: Secondary | ICD-10-CM

## 2020-03-30 DIAGNOSIS — M79662 Pain in left lower leg: Secondary | ICD-10-CM | POA: Diagnosis not present

## 2020-03-30 MED ORDER — IOHEXOL 350 MG/ML SOLN
80.0000 mL | Freq: Once | INTRAVENOUS | Status: AC | PRN
  Administered 2020-03-30: 80 mL via INTRAVENOUS

## 2020-03-30 MED ORDER — ENOXAPARIN SODIUM 80 MG/0.8ML ~~LOC~~ SOLN
1.0000 mg/kg | Freq: Once | SUBCUTANEOUS | Status: AC
  Administered 2020-03-30: 72.5 mg via SUBCUTANEOUS
  Filled 2020-03-30: qty 0.72

## 2020-03-30 NOTE — Progress Notes (Signed)
Left lower extremity venous study completed.     Please see CV Proc for preliminary results.   Vonzell Schlatter, RVT

## 2020-03-30 NOTE — Discharge Instructions (Addendum)
You are seen in the emergency department today for your left leg swelling and pain, as well as your shortness of breath.   CT did not show any blood clot in your lungs.  Unfortunately were able to perform an ultrasound of your leg due to being outside of ultrasound business hours.  You have been scheduled for an ultrasound tomorrow morning of your leg to rule out blood clot.  Please see the attached information.

## 2020-03-30 NOTE — ED Provider Notes (Signed)
Assumed care from PA Sponsellar at shift change.  See prior notes for full H&P.  Briefly, 40 y.o. F here with unilateral left leg swelling.  D-dimer elevated, labs otherwise reassuring.    Plan:  CTA pending.  If negative, anticipate discharge home with orders for venous duplex in AM.  Results for orders placed or performed during the hospital encounter of 83/38/25  Basic metabolic panel  Result Value Ref Range   Sodium 137 135 - 145 mmol/L   Potassium 4.4 3.5 - 5.1 mmol/L   Chloride 106 98 - 111 mmol/L   CO2 26 22 - 32 mmol/L   Glucose, Bld 122 (H) 70 - 99 mg/dL   BUN 17 6 - 20 mg/dL   Creatinine, Ser 0.61 0.44 - 1.00 mg/dL   Calcium 8.9 8.9 - 10.3 mg/dL   GFR, Estimated >60 >60 mL/min   Anion gap 5 5 - 15  CBC  Result Value Ref Range   WBC 6.5 4.0 - 10.5 K/uL   RBC 3.95 3.87 - 5.11 MIL/uL   Hemoglobin 12.3 12.0 - 15.0 g/dL   HCT 38.1 36.0 - 46.0 %   MCV 96.5 80.0 - 100.0 fL   MCH 31.1 26.0 - 34.0 pg   MCHC 32.3 30.0 - 36.0 g/dL   RDW 12.4 11.5 - 15.5 %   Platelets 176 150 - 400 K/uL   nRBC 0.0 0.0 - 0.2 %  D-dimer, quantitative  Result Value Ref Range   D-Dimer, Quant 0.78 (H) 0.00 - 0.50 ug/mL-FEU  Brain natriuretic peptide  Result Value Ref Range   B Natriuretic Peptide 39.0 0.0 - 100.0 pg/mL  I-Stat beta hCG blood, ED  Result Value Ref Range   I-stat hCG, quantitative <5.0 <5 mIU/mL   Comment 3          Troponin I (High Sensitivity)  Result Value Ref Range   Troponin I (High Sensitivity) 3 <18 ng/L  Troponin I (High Sensitivity)  Result Value Ref Range   Troponin I (High Sensitivity) 3 <18 ng/L   DG Chest 2 View  Result Date: 03/29/2020 CLINICAL DATA:  Short of breath, chest pain, left lower extremity swelling for 5 days, tobacco abuse EXAM: CHEST - 2 VIEW COMPARISON:  None. FINDINGS: Frontal and lateral views of the chest demonstrate an unremarkable cardiac silhouette. No acute airspace disease, effusion, or pneumothorax. Diffuse interstitial prominence  consistent with history of tobacco abuse. No acute bony abnormalities. IMPRESSION: 1. No acute intrathoracic process. Electronically Signed   By: Randa Ngo M.D.   On: 03/29/2020 19:49   CT Angio Chest PE W/Cm &/Or Wo Cm  Result Date: 03/30/2020 CLINICAL DATA:  Shortness of breath with ankle swelling EXAM: CT ANGIOGRAPHY CHEST WITH CONTRAST TECHNIQUE: Multidetector CT imaging of the chest was performed using the standard protocol during bolus administration of intravenous contrast. Multiplanar CT image reconstructions and MIPs were obtained to evaluate the vascular anatomy. CONTRAST:  46mL OMNIPAQUE IOHEXOL 350 MG/ML SOLN COMPARISON:  None. FINDINGS: Cardiovascular: There is a optimal opacification of the pulmonary arteries. There is no central,segmental, or subsegmental filling defects within the pulmonary arteries. The heart is normal in size. No pericardial effusion or thickening. No evidence right heart strain. There is normal three-vessel brachiocephalic anatomy without proximal stenosis. The thoracic aorta is normal in appearance. Mediastinum/Nodes: No hilar, mediastinal, or axillary adenopathy. Thyroid gland, trachea, and esophagus demonstrate no significant findings. Lungs/Pleura: Mild bibasilar atelectasis is seen. No pleural effusion or pneumothorax. No airspace consolidation. Upper Abdomen: No acute abnormalities  present in the visualized portions of the upper abdomen. Musculoskeletal: No chest wall abnormality. No acute or significant osseous findings. Review of the MIP images confirms the above findings. IMPRESSION: No central, segmental, or subsegmental pulmonary embolism No acute intrathoracic pathology to explain the patient's symptoms Electronically Signed   By: Prudencio Pair M.D.   On: 03/30/2020 01:40   CTA negative.  As d-dimer was elevated, will give dose of lovenox and have her return in the AM for LE doppler to exclude DVT.     Larene Pickett, PA-C 44/97/53 0051    Delora Fuel, MD 10/27/09 [phone]

## 2020-03-30 NOTE — ED Notes (Signed)
Pt verbalized understanding of vas u/s instructions, follow up and medications.

## 2020-04-17 DIAGNOSIS — K219 Gastro-esophageal reflux disease without esophagitis: Secondary | ICD-10-CM | POA: Insufficient documentation

## 2020-04-17 DIAGNOSIS — G809 Cerebral palsy, unspecified: Secondary | ICD-10-CM | POA: Insufficient documentation

## 2020-04-17 DIAGNOSIS — M5136 Other intervertebral disc degeneration, lumbar region: Secondary | ICD-10-CM | POA: Insufficient documentation

## 2020-04-17 DIAGNOSIS — G43909 Migraine, unspecified, not intractable, without status migrainosus: Secondary | ICD-10-CM | POA: Insufficient documentation

## 2020-04-17 DIAGNOSIS — F419 Anxiety disorder, unspecified: Secondary | ICD-10-CM | POA: Insufficient documentation

## 2020-04-17 DIAGNOSIS — K589 Irritable bowel syndrome without diarrhea: Secondary | ICD-10-CM | POA: Insufficient documentation

## 2020-04-17 DIAGNOSIS — G4700 Insomnia, unspecified: Secondary | ICD-10-CM | POA: Insufficient documentation

## 2020-04-17 DIAGNOSIS — F32A Depression, unspecified: Secondary | ICD-10-CM | POA: Insufficient documentation

## 2020-04-18 ENCOUNTER — Encounter: Payer: Self-pay | Admitting: Cardiology

## 2020-04-18 ENCOUNTER — Encounter: Payer: Self-pay | Admitting: *Deleted

## 2020-04-19 ENCOUNTER — Ambulatory Visit: Payer: Medicaid Other | Admitting: Cardiology

## 2020-04-20 ENCOUNTER — Ambulatory Visit: Payer: Medicaid Other | Admitting: Gastroenterology

## 2020-05-22 ENCOUNTER — Ambulatory Visit: Payer: Medicaid Other | Admitting: Cardiology

## 2020-05-25 ENCOUNTER — Encounter: Payer: Self-pay | Admitting: Gastroenterology

## 2020-05-25 ENCOUNTER — Ambulatory Visit (INDEPENDENT_AMBULATORY_CARE_PROVIDER_SITE_OTHER): Payer: Medicaid Other | Admitting: Gastroenterology

## 2020-05-25 VITALS — BP 90/60 | HR 76 | Ht 61.0 in | Wt 179.5 lb

## 2020-05-25 DIAGNOSIS — K219 Gastro-esophageal reflux disease without esophagitis: Secondary | ICD-10-CM | POA: Diagnosis not present

## 2020-05-25 DIAGNOSIS — R197 Diarrhea, unspecified: Secondary | ICD-10-CM

## 2020-05-25 DIAGNOSIS — R1032 Left lower quadrant pain: Secondary | ICD-10-CM

## 2020-05-25 MED ORDER — DICYCLOMINE HCL 10 MG PO CAPS: 10.0000 mg | ORAL_CAPSULE | Freq: Four times a day (QID) | ORAL | 2 refills | Status: DC | PRN

## 2020-05-25 NOTE — Progress Notes (Signed)
History of Present Illness: This is a 40 year old female referred by Peggy Sleeper, PA-C for the evaluation of GERD, intermittent diarrhea, intermittent nausea, intermittent LLQ pain. She has a history of GERD and IBS.  She relates a several year history of GERD.  She states she underwent an upper endoscopy in [place] about 4 years ago which may have showed an esophageal ring otherwise negative.  She has frequent problems with urgent diarrhea associated with crampy left lower quadrant pain pressure and bloating.  She notes that caffeine generally exacerbates her diarrhea and abdominal pain so she minimizes it.  She has not noted any other foods or beverages that predictably exacerbate her symptoms.  She occasionally has difficulties with belching.  She was started on Nexium by her PCP recently and her reflux symptoms and nausea have substantially improved.  L4-L5 spine surgery is scheduled on June 10.  Denies weight loss, constipation, change in stool caliber, melena, hematochezia, vomiting, dysphagia, chest pain.    Allergies  Allergen Reactions  . Shellfish Allergy Rash  . Ambien [Zolpidem] Other (See Comments)    (Ambien) insomnia Heart palpitations  (Ambien) insomnia Heart palpitations    . Cymbalta [Duloxetine Hcl]   . Duloxetine Other (See Comments)    depression Cymbalta   . Paxil [Paroxetine Hcl]     Other reaction(s): Other (See Comments) (Paxil) Heart palpitations   . Remeron [Mirtazapine] Palpitations    Other reaction(s): Other (See Comments) Heart Palpitations, drowsy Heart Palpitations, drowsy   . Trazodone Palpitations   Outpatient Medications Prior to Visit  Medication Sig Dispense Refill  . amitriptyline (ELAVIL) 25 MG tablet Take 25 mg by mouth at bedtime.    . buprenorphine (BUTRANS) 15 MCG/HR Place 1 patch onto the skin once a week.    . esomeprazole (NEXIUM) 40 MG capsule Take 40 mg by mouth daily at 12 noon.    . gabapentin (NEURONTIN) 300 MG capsule  Take 300 mg by mouth 3 (three) times daily as needed for pain.    . hydrochlorothiazide (HYDRODIURIL) 25 MG tablet Take 25 mg by mouth daily.    . magnesium gluconate (MAGONATE) 500 MG tablet Take 500 mg by mouth daily.    Marland Kitchen oxyCODONE-acetaminophen (PERCOCET) 10-325 MG tablet Take 2 tablets by mouth every 8 (eight) hours as needed for pain.    Marland Kitchen sertraline (ZOLOFT) 100 MG tablet Take 100 mg by mouth daily.    . SUMAtriptan (IMITREX) 25 MG tablet Take 1 tablet by mouth as needed.    . tizanidine (ZANAFLEX) 6 MG capsule Take 6 mg by mouth 3 (three) times daily as needed for muscle spasms.    . ALPRAZolam (XANAX) 0.25 MG tablet Take 0.25 mg by mouth at bedtime as needed.    . busPIRone (BUSPAR) 5 MG tablet Take 5 mg by mouth 3 (three) times daily as needed for anxiety.    . meloxicam (MOBIC) 7.5 MG tablet Take 7.5 mg by mouth daily.    . mirtazapine (REMERON) 15 MG tablet Take 7.5 mg by mouth at bedtime. Take 1/2 tablet daily    . nystatin (MYCOSTATIN) 100000 UNIT/ML suspension Take 4 mLs by mouth 4 (four) times daily.    . predniSONE (DELTASONE) 20 MG tablet Take 20 mg by mouth 2 (two) times daily with a meal.    . promethazine-codeine (PHENERGAN WITH CODEINE) 6.25-10 MG/5ML syrup Take 5 mLs by mouth every 6 (six) hours as needed for cough.    . temazepam (RESTORIL) 15  MG capsule Take 15 mg by mouth at bedtime as needed for sleep.     No facility-administered medications prior to visit.   Past Medical History:  Diagnosis Date  . Anxiety   . Arthritis   . Cerebral palsy ([place])   . DDD (degenerative disc disease), lumbar   . Depression   . GERD (gastroesophageal reflux disease)   . IBS (irritable bowel syndrome)   . Insomnia disorder 07/28/2019  . Migraine   . Status post dilation of esophageal narrowing    Past Surgical History:  Procedure Laterality Date  . ABDOMINAL HYSTERECTOMY  10/2018  . BACK SURGERY    . CESAREAN SECTION    . CORNEAL TRANSPLANT     2009, 2010  . LAPAROSCOPIC  OOPHERECTOMY Right 2020  . LUMBAR FUSION     X2 2007 and 2019   Social History   Socioeconomic History  . Marital status: Married    Spouse name: Not on file  . Number of children: 3  . Years of education: Not on file  . Highest education level: Not on file  Occupational History  . Not on file  Tobacco Use  . Smoking status: Never Smoker  . Smokeless tobacco: Never Used  Vaping Use  . Vaping Use: Every day  Substance and Sexual Activity  . Alcohol use: Yes    Comment: 2 per day  . Drug use: Never  . Sexual activity: Not on file  Other Topics Concern  . Not on file  Social History Narrative  . Not on file   Social Determinants of Health   Financial Resource Strain: Not on file  Food Insecurity: Not on file  Transportation Needs: Not on file  Physical Activity: Not on file  Stress: Not on file  Social Connections: Not on file   Family History  Problem Relation Age of Onset  . Congestive Heart Failure Mother   . Stroke Mother   . Skin cancer Mother   . Clotting disorder Mother   . Leukemia Father   . Alzheimer's disease Father   . Breast cancer Maternal Aunt       Review of Systems: Pertinent positive and negative review of systems were noted in the above HPI section. All other review of systems were otherwise negative.   Physical Exam: General: Well developed, well nourished, no acute distress Head: Normocephalic and atraumatic Eyes: Sclerae anicteric, EOMI Ears: Normal auditory acuity Mouth: Not examined, mask on during Covid-19 pandemic Neck: Supple, no masses or thyromegaly Lungs: Clear throughout to auscultation Heart: Regular rate and rhythm; no murmurs, rubs or bruits Abdomen: Soft, mild LLQ and RUQ tenderness and non distended. No masses, hepatosplenomegaly or hernias noted. Normal Bowel sounds Rectal: Not done  Musculoskeletal: Symmetrical with no gross deformities  Skin: No lesions on visible extremities Pulses:  Normal pulses  noted Extremities: No clubbing, cyanosis, edema or deformities noted Neurological: Alert oriented x 4, grossly nonfocal Cervical Nodes:  No significant cervical adenopathy Inguinal Nodes: No significant inguinal adenopathy Psychological:  Alert and cooperative. Normal mood and affect   Assessment and Recommendations:  1. GERD.  Follow antireflux measures.  Continue Nexium 40 mg p.o. daily.  Gaviscon or Gas-X as needed gas and belching.  Unfortunately she does not recall the name of the physician or facility where her EGD was performed in [place].  If she does remember we will request records.  Follow-up with PCP for ongoing care.  2. LLQ abdominal pain, intermittent diarrhea.  Suspected IBS.  Lactose-free  diet for 7 days.  If diarrhea persists review and eliminate problematic foods in the a low FODMAP diet.  Begin dicyclomine 10 mg 4 times daily as needed. If her symptoms are controlled she is advised to return to her PCP for ongoing care.  If her symptoms are persistent she is advised to return after she recovers from her back surgery.   cc: Peggy Sleeper, PA-C [place],  [place] [id number]

## 2020-05-25 NOTE — Patient Instructions (Signed)
We have sent the following medications to your pharmacy for you to pick up at your convenience: dicyclomine.   Patient advised to avoid spicy, acidic, citrus, chocolate, mints, fruit and fruit juices.  Limit the intake of caffeine, alcohol and Soda.  Don't exercise too soon after eating.  Don't lie down within 3-4 hours of eating.  Elevate the head of your bed.  Please start a lactose free diet x 1 week, if you see no improvement in your symptoms then start a low-fodmap diet.  Thank you for choosing me and [facility].  Pricilla Riffle. Dagoberto Ligas., MD., Marval Regal

## 2020-06-16 HISTORY — PX: BACK SURGERY: SHX140

## 2020-07-12 ENCOUNTER — Emergency Department (HOSPITAL_COMMUNITY)
Admission: EM | Admit: 2020-07-12 | Discharge: 2020-07-12 | Discharge status: Against Medical Advice | Payer: Medicaid Other

## 2020-10-18 ENCOUNTER — Other Ambulatory Visit: Payer: Self-pay | Admitting: Orthopaedic Surgery

## 2020-10-19 ENCOUNTER — Other Ambulatory Visit: Payer: Self-pay

## 2020-10-19 ENCOUNTER — Encounter (HOSPITAL_BASED_OUTPATIENT_CLINIC_OR_DEPARTMENT_OTHER): Payer: Self-pay | Admitting: Orthopaedic Surgery

## 2020-10-30 ENCOUNTER — Encounter (HOSPITAL_BASED_OUTPATIENT_CLINIC_OR_DEPARTMENT_OTHER): Payer: Self-pay | Admitting: Orthopaedic Surgery

## 2020-10-31 ENCOUNTER — Encounter (HOSPITAL_COMMUNITY)
Admission: RE | Disposition: A | Discharge status: Home / Self Care | Payer: Self-pay | Source: Home / Self Care | Attending: Orthopaedic Surgery

## 2020-10-31 SURGERY — REPAIR EXTENSOR TENDON WITH METATARSAL OSTEOTOMY AND OPEN REDUCTION INTERNAL FIXATION (ORIF) METATARSAL
Anesthesia: Choice | Laterality: Right

## 2020-11-01 ENCOUNTER — Encounter (HOSPITAL_COMMUNITY): Payer: Self-pay | Admitting: Orthopaedic Surgery

## 2020-11-01 NOTE — Progress Notes (Signed)
Anesthesia Chart Review: SAME DAY WORK-UP  Case: [id number] Date/Time: 11/02/20 1427   Procedure: OPEN TREATMENT OF RIGHT SECOND AND THIRD METATARSAL FRACTURE AND SURGERY AS INDICATED (Right)   Anesthesia type: Choice   Pre-op diagnosis: RIGHT SECOND AND THIRD METATARSAL SHAFT FRACTURES   Location: [facility] OR ROOM 04 / [facility]   Surgeons: Erle Crocker, MD       DISCUSSION: Patient is a 40 year old female scheduled for the above procedure.  History includes never smoker, cerebral palsy, IBS, GERD, anxiety, migraines, insomnia, hysterectomy (10/2018), spinal surgery (L5-S1 fusion ~ 2007; hardware removal L5-S1, posterolateral fusion L4-S1 10/07/17; L4-L5 PSF and revision L4-5 TLIF 06/16/20), corneal transplant.   She moved to [place] from [place] ~ 2021.  Reviewed several records in [place]. She did tolerate recent back surgery at [facility] in June. PAT phone RN has not yet spoken with patient for preoperative interview. She is a same day work-up, so labs as indicated and anesthesia team to evaluate on the day of surgery.     VS: Ht 5\' 1"  (1.549 m)   Wt 81.6 kg   BMI 33.99 kg/m  BP Readings from Last 3 Encounters:  07/12/20 124/72  05/25/20 90/60  03/30/20 114/76   Pulse Readings from Last 3 Encounters:  07/12/20 (!) 106  05/25/20 76  03/30/20 74     PROVIDERS: Terald Sleeper, PA-C is PCP  Lucio Edward, MD is GI   LABS: For day of surgery. As of 03/29/20, Cr 0.61, CBC normal.    IMAGES: CTA Chest 03/30/20: IMPRESSION: No central, segmental, or subsegmental pulmonary embolism No acute intrathoracic pathology to explain the patient's symptoms   EKG: 03/29/20: Normal sinus rhythm Cannot rule out Anterior infarct , age undetermined Abnormal ECG No old tracing to compare Confirmed by Delora Fuel ([id number]) on 03/30/2020 1:15:20 AM   CV: LLE Venous US 03/30/20: Summary:  LEFT:  - There is no evidence of deep vein thrombosis in the lower extremity.  -  There is no evidence of superficial venous thrombosis.  - No cystic structure found in the popliteal fossa.      Past Medical History:  Diagnosis Date   Anxiety    Arthritis    Cerebral palsy ([place])    DDD (degenerative disc disease), lumbar    Depression    GERD (gastroesophageal reflux disease)    IBS (irritable bowel syndrome)    Insomnia disorder 07/28/2019   Migraine    Status post dilation of esophageal narrowing     Past Surgical History:  Procedure Laterality Date   ABDOMINAL HYSTERECTOMY  10/2018   BACK SURGERY     CESAREAN SECTION     CORNEAL TRANSPLANT     2009, 2010   LAPAROSCOPIC OOPHERECTOMY Right 2020   LUMBAR FUSION     X2 2007 and 2019    MEDICATIONS: No current facility-administered medications for this encounter.    carboxymethylcellul-glycerin (OPTIVE) 0.5-0.9 % ophthalmic solution   esomeprazole (NEXIUM) 40 MG capsule   famotidine (PEPCID) 40 MG tablet   gabapentin (NEURONTIN) 300 MG capsule   hydrochlorothiazide (HYDRODIURIL) 25 MG tablet   ibuprofen (ADVIL) 200 MG tablet   methocarbamol (ROBAXIN) 750 MG tablet   oxyCODONE-acetaminophen (PERCOCET) 10-325 MG tablet   promethazine (PHENERGAN) 12.5 MG tablet   rizatriptan (MAXALT-MLT) 10 MG disintegrating tablet   sertraline (ZOLOFT) 100 MG tablet    Myra Gianotti, PA-C Surgical Short Stay/Anesthesiology [facility] Phone [phone] [facility] Phone [phone] 11/01/2020 12:14 PM

## 2020-11-01 NOTE — Progress Notes (Signed)
DUE TO COVID-19 ONLY ONE VISITOR IS ALLOWED TO COME WITH YOU AND STAY IN THE WAITING ROOM ONLY DURING PRE OP AND PROCEDURE DAY OF SURGERY.   PCP - Particia Nearing, PA-C   Cardiologist - n/a Neurosurgery - Dr Atilano Ina GI - Dr Lucio Edward  Chest x-ray - 03/29/20 (2V) EKG - 03/29/20 Stress Test - n/a ECHO - n/a Cardiac Cath - n/a  ICD Pacemaker/Loop - n/a  Sleep Study -  n/a CPAP - none  Diabetes - n/a  ERAS: Clear liquids til 11;40 AM DAY OF SURGERY.  Clears reviewed with patient.  Anesthesia review: Yes  STOP now taking any Aspirin (unless otherwise instructed by your surgeon), Aleve, Naproxen, Ibuprofen, Motrin, Advil, Goody's, BC's, all herbal medications, fish oil, and all vitamins.   Coronavirus Screening Covid test n/a Ambulatory Surgery  Do you have any of the following symptoms:  Cough yes/no: No Fever (>100.85F)  yes/no: No Runny nose yes/no: No Sore throat yes/no: No Difficulty breathing/shortness of breath  yes/no: No  Have you traveled in the last 14 days and where? yes/no: No  Patient verbalized understanding of instructions that were given via phone.

## 2020-11-01 NOTE — Anesthesia Preprocedure Evaluation (Addendum)
Anesthesia Evaluation  Patient identified by MRN, date of birth, ID band Patient awake    Reviewed: Allergy & Precautions, H&P , NPO status , Patient's Chart, lab work & pertinent test results  Airway Mallampati: II   Neck ROM: full    Dental   Pulmonary Patient abstained from smoking.,    breath sounds clear to auscultation       Cardiovascular negative cardio ROS   Rhythm:regular Rate:Normal     Neuro/Psych  Headaches, PSYCHIATRIC DISORDERS Anxiety Depression Cerebral palsy    GI/Hepatic GERD  ,  Endo/Other    Renal/GU      Musculoskeletal  (+) Arthritis ,   Abdominal   Peds  Hematology   Anesthesia Other Findings   Reproductive/Obstetrics                             Anesthesia Physical Anesthesia Plan  ASA: 3  Anesthesia Plan: MAC and Regional   Post-op Pain Management:    Induction: Intravenous  PONV Risk Score and Plan: 2 and Ondansetron, Propofol infusion, Treatment may vary due to age or medical condition and Midazolam  Airway Management Planned: Simple Face Mask  Additional Equipment:   Intra-op Plan:   Post-operative Plan:   Informed Consent: I have reviewed the patients History and Physical, chart, labs and discussed the procedure including the risks, benefits and alternatives for the proposed anesthesia with the patient or authorized representative who has indicated his/her understanding and acceptance.     Dental advisory given  Plan Discussed with: CRNA, Anesthesiologist and Surgeon  Anesthesia Plan Comments: (PAT note written 11/01/2020 by Myra Gianotti, PA-C. )       Anesthesia Quick Evaluation

## 2020-11-02 ENCOUNTER — Ambulatory Visit (HOSPITAL_COMMUNITY): Payer: Medicaid Other | Admitting: Vascular Surgery

## 2020-11-02 ENCOUNTER — Ambulatory Visit (HOSPITAL_COMMUNITY)
Admission: RE | Admit: 2020-11-02 | Discharge: 2020-11-02 | Disposition: A | Discharge status: Home / Self Care | Payer: Medicaid Other | Attending: Orthopaedic Surgery | Admitting: Orthopaedic Surgery

## 2020-11-02 ENCOUNTER — Ambulatory Visit (HOSPITAL_COMMUNITY): Payer: Medicaid Other

## 2020-11-02 ENCOUNTER — Other Ambulatory Visit: Payer: Self-pay

## 2020-11-02 ENCOUNTER — Encounter (HOSPITAL_COMMUNITY)
Admission: RE | Disposition: A | Discharge status: Home / Self Care | Payer: Self-pay | Source: Home / Self Care | Attending: Orthopaedic Surgery

## 2020-11-02 DIAGNOSIS — S92321A Displaced fracture of second metatarsal bone, right foot, initial encounter for closed fracture: Secondary | ICD-10-CM | POA: Insufficient documentation

## 2020-11-02 DIAGNOSIS — S92331A Displaced fracture of third metatarsal bone, right foot, initial encounter for closed fracture: Secondary | ICD-10-CM | POA: Insufficient documentation

## 2020-11-02 DIAGNOSIS — X509XXA Other and unspecified overexertion or strenuous movements or postures, initial encounter: Secondary | ICD-10-CM | POA: Diagnosis not present

## 2020-11-02 DIAGNOSIS — Z419 Encounter for procedure for purposes other than remedying health state, unspecified: Secondary | ICD-10-CM

## 2020-11-02 DIAGNOSIS — F17290 Nicotine dependence, other tobacco product, uncomplicated: Secondary | ICD-10-CM | POA: Insufficient documentation

## 2020-11-02 HISTORY — PX: REPAIR EXTENSOR TENDON WITH METATARSAL OSTEOTOMY AND OPEN REDUCTION IN: SHX5698

## 2020-11-02 LAB — CBC
HCT: 38.9 % (ref 36.0–46.0)
Hemoglobin: 11.9 g/dL — ABNORMAL LOW (ref 12.0–15.0)
MCH: 24.9 pg — ABNORMAL LOW (ref 26.0–34.0)
MCHC: 30.6 g/dL (ref 30.0–36.0)
MCV: 81.6 fL (ref 80.0–100.0)
Platelets: 328 10*3/uL (ref 150–400)
RBC: 4.77 MIL/uL (ref 3.87–5.11)
RDW: 18.2 % — ABNORMAL HIGH (ref 11.5–15.5)
WBC: 6 10*3/uL (ref 4.0–10.5)
nRBC: 0 % (ref 0.0–0.2)

## 2020-11-02 SURGERY — REPAIR EXTENSOR TENDON WITH METATARSAL OSTEOTOMY AND OPEN REDUCTION INTERNAL FIXATION (ORIF) METATARSAL
Anesthesia: Monitor Anesthesia Care | Laterality: Right

## 2020-11-02 MED ORDER — CHLORHEXIDINE GLUCONATE 0.12 % MT SOLN: 15.0000 mL | Freq: Once | OROMUCOSAL | Status: AC

## 2020-11-02 MED ORDER — FENTANYL CITRATE (PF) 100 MCG/2ML IJ SOLN: 100.0000 ug | Freq: Once | INTRAMUSCULAR | Status: DC

## 2020-11-02 MED ORDER — OXYCODONE HCL 5 MG PO TABS: 5.0000 mg | ORAL_TABLET | ORAL | 0 refills | Status: AC | PRN

## 2020-11-02 MED ORDER — LACTATED RINGERS IV SOLN: INTRAVENOUS | Status: DC

## 2020-11-02 MED ORDER — OXYCODONE HCL 5 MG/5ML PO SOLN
5.0000 mg | Freq: Once | ORAL | Status: DC | PRN
Start: 2020-11-02 — End: 2020-11-02

## 2020-11-02 MED ORDER — CHLORHEXIDINE GLUCONATE 0.12 % MT SOLN
OROMUCOSAL | Status: AC
  Administered 2020-11-02: 15 mL via OROMUCOSAL
  Filled 2020-11-02: qty 15

## 2020-11-02 MED ORDER — FENTANYL CITRATE (PF) 100 MCG/2ML IJ SOLN: 25.0000 ug | INTRAMUSCULAR | Status: DC | PRN

## 2020-11-02 MED ORDER — CEFAZOLIN SODIUM-DEXTROSE 2-4 GM/100ML-% IV SOLN
INTRAVENOUS | Status: AC
  Filled 2020-11-02: qty 100

## 2020-11-02 MED ORDER — FENTANYL CITRATE (PF) 100 MCG/2ML IJ SOLN
INTRAMUSCULAR | Status: AC
  Administered 2020-11-02: 50 ug
  Filled 2020-11-02: qty 2

## 2020-11-02 MED ORDER — ROPIVACAINE HCL 5 MG/ML IJ SOLN
INTRAMUSCULAR | Status: DC | PRN
  Administered 2020-11-02: 30 mL via PERINEURAL

## 2020-11-02 MED ORDER — MIDAZOLAM HCL 2 MG/2ML IJ SOLN: 2.0000 mg | Freq: Once | INTRAMUSCULAR | Status: DC

## 2020-11-02 MED ORDER — 0.9 % SODIUM CHLORIDE (POUR BTL) OPTIME
TOPICAL | Status: DC | PRN
  Administered 2020-11-02: 1000 mL

## 2020-11-02 MED ORDER — MIDAZOLAM HCL 5 MG/5ML IJ SOLN
INTRAMUSCULAR | Status: DC | PRN
  Administered 2020-11-02: 2 mg via INTRAVENOUS

## 2020-11-02 MED ORDER — ORAL CARE MOUTH RINSE: 15.0000 mL | Freq: Once | OROMUCOSAL | Status: AC

## 2020-11-02 MED ORDER — LACTATED RINGERS IV SOLN: INTRAVENOUS | Status: DC | PRN

## 2020-11-02 MED ORDER — ONDANSETRON HCL 4 MG/2ML IJ SOLN
INTRAMUSCULAR | Status: DC | PRN
  Administered 2020-11-02: 4 mg via INTRAVENOUS

## 2020-11-02 MED ORDER — ONDANSETRON HCL 4 MG/2ML IJ SOLN: 4.0000 mg | Freq: Four times a day (QID) | INTRAMUSCULAR | Status: DC | PRN

## 2020-11-02 MED ORDER — MIDAZOLAM HCL 2 MG/2ML IJ SOLN
INTRAMUSCULAR | Status: AC
  Filled 2020-11-02: qty 2

## 2020-11-02 MED ORDER — CEFAZOLIN SODIUM-DEXTROSE 2-4 GM/100ML-% IV SOLN
2.0000 g | INTRAVENOUS | Status: AC
Start: 2020-11-03 — End: 2020-11-02
  Administered 2020-11-02: 2 g via INTRAVENOUS

## 2020-11-02 MED ORDER — OXYCODONE HCL 5 MG PO TABS: 5.0000 mg | ORAL_TABLET | Freq: Once | ORAL | Status: DC | PRN

## 2020-11-02 MED ORDER — MIDAZOLAM HCL 2 MG/2ML IJ SOLN
INTRAMUSCULAR | Status: AC
  Administered 2020-11-02: 1 mg
  Filled 2020-11-02: qty 2

## 2020-11-02 MED ORDER — PROPOFOL 500 MG/50ML IV EMUL
INTRAVENOUS | Status: DC | PRN
  Administered 2020-11-02: 150 ug/kg/min via INTRAVENOUS

## 2020-11-02 MED ORDER — PROPOFOL 10 MG/ML IV BOLUS
INTRAVENOUS | Status: DC | PRN
  Administered 2020-11-02: 50 mg via INTRAVENOUS

## 2020-11-02 SURGICAL SUPPLY — 58 items
.062 K WIRE ×4 IMPLANT
BENZOIN TINCTURE PRP APPL 2/3 (GAUZE/BANDAGES/DRESSINGS) IMPLANT
BLADE SURG 15 STRL LF DISP TIS (BLADE) ×2 IMPLANT
BLADE SURG 15 STRL SS (BLADE) ×2
BNDG COHESIVE 4X5 TAN ST LF (GAUZE/BANDAGES/DRESSINGS) IMPLANT
BNDG ELASTIC 4X5.8 VLCR STR LF (GAUZE/BANDAGES/DRESSINGS) ×2 IMPLANT
BNDG ELASTIC 6X5.8 VLCR STR LF (GAUZE/BANDAGES/DRESSINGS) IMPLANT
BNDG ESMARK 4X9 LF (GAUZE/BANDAGES/DRESSINGS) ×2 IMPLANT
CHLORAPREP W/TINT 26 (MISCELLANEOUS) ×2 IMPLANT
COVER BACK TABLE 60X90IN (DRAPES) ×2 IMPLANT
CUFF TOURN SGL QUICK 34 (TOURNIQUET CUFF)
CUFF TRNQT CYL 34X4.125X (TOURNIQUET CUFF) IMPLANT
DRAPE C-ARMOR (DRAPES) IMPLANT
DRAPE EXTREMITY T 121X128X90 (DISPOSABLE) ×2 IMPLANT
DRAPE IMP U-DRAPE 54X76 (DRAPES) ×2 IMPLANT
DRAPE U-SHAPE 47X51 STRL (DRAPES) ×2 IMPLANT
DRSG XEROFORM 1X8 (GAUZE/BANDAGES/DRESSINGS) ×2 IMPLANT
ELECT REM PT RETURN 9FT ADLT (ELECTROSURGICAL) ×2
ELECTRODE REM PT RTRN 9FT ADLT (ELECTROSURGICAL) ×1 IMPLANT
GAUZE SPONGE 4X4 12PLY STRL (GAUZE/BANDAGES/DRESSINGS) ×2 IMPLANT
GAUZE SPONGE 4X4 12PLY STRL LF (GAUZE/BANDAGES/DRESSINGS) ×2 IMPLANT
GAUZE XEROFORM 1X8 LF (GAUZE/BANDAGES/DRESSINGS) IMPLANT
GLOVE SRG 8 PF TXTR STRL LF DI (GLOVE) ×1 IMPLANT
GLOVE SURG ENC TEXT LTX SZ7.5 (GLOVE) ×2 IMPLANT
GLOVE SURG UNDER POLY LF SZ8 (GLOVE) ×1
GOWN STRL REUS W/ TWL LRG LVL3 (GOWN DISPOSABLE) ×1 IMPLANT
GOWN STRL REUS W/ TWL XL LVL3 (GOWN DISPOSABLE) ×1 IMPLANT
GOWN STRL REUS W/TWL LRG LVL3 (GOWN DISPOSABLE) ×1
GOWN STRL REUS W/TWL XL LVL3 (GOWN DISPOSABLE) ×1
K-WIRE DBL TROCAR .062X4 (WIRE) ×4
KWIRE DBL TROCAR .062X4 (WIRE) ×2 IMPLANT
NDL SAFETY ECLIPSE 18X1.5 (NEEDLE) IMPLANT
NEEDLE HYPO 18GX1.5 SHARP (NEEDLE)
NS IRRIG 1000ML POUR BTL (IV SOLUTION) ×2 IMPLANT
PACK BASIN DAY SURGERY FS (CUSTOM PROCEDURE TRAY) ×2 IMPLANT
PAD CAST 4YDX4 CTTN HI CHSV (CAST SUPPLIES) ×1 IMPLANT
PADDING CAST COTTON 4X4 STRL (CAST SUPPLIES) ×1
PADDING CAST SYNTHETIC 4 (CAST SUPPLIES)
PADDING CAST SYNTHETIC 4X4 STR (CAST SUPPLIES) IMPLANT
PENCIL SMOKE EVACUATOR (MISCELLANEOUS) ×2 IMPLANT
SHEET MEDIUM DRAPE 40X70 STRL (DRAPES) ×2 IMPLANT
SLEEVE SCD COMPRESS KNEE MED (STOCKING) ×2 IMPLANT
SPLINT FIBERGLASS 4X30 (CAST SUPPLIES) IMPLANT
SPONGE T-LAP 18X18 ~~LOC~~+RFID (SPONGE) IMPLANT
STOCKINETTE 6  STRL (DRAPES) ×1
STOCKINETTE 6 STRL (DRAPES) ×1 IMPLANT
STRIP CLOSURE SKIN 1/2X4 (GAUZE/BANDAGES/DRESSINGS) IMPLANT
SUCTION FRAZIER HANDLE 10FR (MISCELLANEOUS) ×1
SUCTION TUBE FRAZIER 10FR DISP (MISCELLANEOUS) ×1 IMPLANT
SUT ETHILON 3 0 PS 1 (SUTURE) ×2 IMPLANT
SUT FIBERWIRE 2-0 18 17.9 3/8 (SUTURE)
SUT MNCRL AB 3-0 PS2 18 (SUTURE) ×2 IMPLANT
SUT PDS AB 2-0 CT2 27 (SUTURE) IMPLANT
SUT VIC AB 3-0 FS2 27 (SUTURE) IMPLANT
SUTURE FIBERWR 2-0 18 17.9 3/8 (SUTURE) IMPLANT
SYR 10ML LL (SYRINGE) IMPLANT
TOWEL GREEN STERILE FF (TOWEL DISPOSABLE) ×2 IMPLANT
TUBE CONNECTING 20X1/4 (TUBING) ×2 IMPLANT

## 2020-11-02 NOTE — Anesthesia Procedure Notes (Signed)
Anesthesia Regional Block: Popliteal block   Pre-Anesthetic Checklist: , timeout performed,  Correct Patient, Correct Site, Correct Laterality,  Correct Procedure, Correct Position, site marked,  Risks and benefits discussed,  Surgical consent,  Pre-op evaluation,  At surgeon's request and post-op pain management  Laterality: Right  Prep: chloraprep       Needles:  Injection technique: Single-shot  Needle Type: Echogenic Stimulator Needle          Additional Needles:   Procedures:, nerve stimulator,,,,,     Nerve Stimulator or Paresthesia:  Response: plantar flexion of foot, 0.45 mA  Additional Responses:   Narrative:  Start time: 11/02/2020 2:27 PM End time: 11/02/2020 2:39 PM Injection made incrementally with aspirations every 5 mL.  Performed by: Personally  Anesthesiologist: Albertha Ghee, MD  Additional Notes: Functioning IV was confirmed and monitors were applied.  A 69mm 21ga Arrow echogenic stimulator needle was used. Sterile prep and drape,hand hygiene and sterile gloves were used.  Negative aspiration and negative test dose prior to incremental administration of local anesthetic. The patient tolerated the procedure well.  Ultrasound guidance: relevent anatomy identified, needle position confirmed, local anesthetic spread visualized around nerve(s), vascular puncture avoided.  Image printed for medical record.

## 2020-11-02 NOTE — H&P (Signed)
PREOPERATIVE H&P  Chief Complaint: Right foot pain  HPI: Peggy Harris is a 40 y.o. female who presents for preoperative history and physical with a diagnosis of right second and third metatarsal fractures, displaced.  She sustained these when she hyper extended her foot.. Symptoms are rated as moderate to severe, and have been worsening.  This is significantly impairing activities of daily living.  She has elected for surgical management.   Past Medical History:  Diagnosis Date   Anxiety    Arthritis    Cerebral palsy ([place])    walker for ambulation   DDD (degenerative disc disease), lumbar    Depression    GERD (gastroesophageal reflux disease)    IBS (irritable bowel syndrome)    Insomnia disorder 07/28/2019   Migraine    Status post dilation of esophageal narrowing    Past Surgical History:  Procedure Laterality Date   ABDOMINAL HYSTERECTOMY  10/2018   BACK SURGERY  06/16/2020   L4-5 TLIF Revision at [place]     x2 - 2006, 2014   CORNEAL TRANSPLANT     2009, 2010   LAPAROSCOPIC OOPHERECTOMY Right 2020   LUMBAR FUSION     X2 2007 and 2019 L4-L5   Social History   Socioeconomic History   Marital status: Married    Spouse name: Not on file   Number of children: 3   Years of education: Not on file   Highest education level: Not on file  Occupational History   Not on file  Tobacco Use   Smoking status: Never   Smokeless tobacco: Never  Vaping Use   Vaping Use: Every day   Substances: Nicotine, Flavoring  Substance and Sexual Activity   Alcohol use: Yes    Alcohol/week: 14.0 standard drinks    Types: 14 Standard drinks or equivalent per week    Comment: 2 per day wine/liquor   Drug use: Never   Sexual activity: Yes    Birth control/protection: Surgical    Comment: Hysterectomy  Other Topics Concern   Not on file  Social History Narrative   Not on file   Social Determinants of Health   Financial Resource Strain: Not on file   Food Insecurity: Not on file  Transportation Needs: Not on file  Physical Activity: Not on file  Stress: Not on file  Social Connections: Not on file   Family History  Problem Relation Age of Onset   Congestive Heart Failure Mother    Stroke Mother    Skin cancer Mother    Clotting disorder Mother    Leukemia Father    Alzheimer's disease Father    Breast cancer Maternal Aunt    Allergies  Allergen Reactions   Shellfish Allergy Rash   Ambien [Zolpidem] Other (See Comments)    (Ambien) insomnia Heart palpitations  (Ambien) insomnia Heart palpitations     Duloxetine Other (See Comments)    Increased depression (Cymbalta)    Paxil [Paroxetine Hcl]     Other reaction(s): Other (See Comments) (Paxil) Heart palpitations    Quetiapine Other (See Comments) and Palpitations    nightmares   Remeron [Mirtazapine] Palpitations    Other reaction(s): Other (See Comments) Heart Palpitations, drowsy Heart Palpitations, drowsy    Trazodone Palpitations   Prior to Admission medications   Medication Sig Start Date End Date Taking? Authorizing Provider  esomeprazole (NEXIUM) 40 MG capsule Take 40 mg by mouth daily as needed (indigestion/heartburn.).   Yes [provider]  famotidine (PEPCID) 40 MG tablet Take 40 mg by mouth at bedtime as needed for nausea. 10/25/20  Yes [provider]  gabapentin (NEURONTIN) 300 MG capsule Take 300-600 mg by mouth 3 (three) times daily as needed (nerve pain.).   Yes [provider]  ibuprofen (ADVIL) 200 MG tablet Take 800 mg by mouth every 8 (eight) hours as needed (for pain.).   Yes [provider]  methocarbamol (ROBAXIN) 750 MG tablet Take 750 mg by mouth 3 (three) times daily as needed for muscle spasms.   Yes [provider]  oxyCODONE-acetaminophen (PERCOCET) 10-325 MG tablet Take 1 tablet by mouth every 4 (four) hours as needed for pain.   Yes [provider]  promethazine (PHENERGAN) 12.5  MG tablet Take 12.5-25 mg by mouth 3 (three) times daily as needed for nausea/vomiting. 09/29/20  Yes [provider]  rizatriptan (MAXALT-MLT) 10 MG disintegrating tablet Take 10 mg by mouth daily as needed for migraine. 10/06/20  Yes [provider]  sertraline (ZOLOFT) 100 MG tablet Take 100 mg by mouth in the morning.   Yes [provider]  carboxymethylcellul-glycerin (OPTIVE) 0.5-0.9 % ophthalmic solution Place 1-2 drops into both eyes 3 (three) times daily as needed (dry/irritated eyes.).    [provider]  hydrochlorothiazide (HYDRODIURIL) 25 MG tablet Take 25 mg by mouth daily as needed (fluid retention).    [provider]     Positive ROS: All other systems have been reviewed and were otherwise negative with the exception of those mentioned in the HPI and as above.  Physical Exam:  Vitals:   11/02/20 1455 11/02/20 1500  BP: (!) 100/40 (!) 97/52  Pulse: 63 72  Resp: 14 19  Temp:    SpO2: 100% 100%   General: Alert, no acute distress Cardiovascular: No pedal edema Respiratory: No cyanosis, no use of accessory musculature GI: No organomegaly, abdomen is soft and non-tender Skin: No lesions in the area of chief complaint Neurologic: Sensation intact distally Psychiatric: Patient is competent for consent with normal mood and affect Lymphatic: No axillary or cervical lymphadenopathy  MUSCULOSKELETAL: Right foot demonstrates swelling.  There is dorsal prominence due to bony displacement.  No open wounds.  Ecchymosis present.  Tender to palpation.  She is able to wiggle toes.  Endorses sensation light touch dorsally.  Foot is warm and well-perfused.  Assessment: Right second third metatarsal fractures, displaced and shortened   Plan: Plan for open treatment of her right second and third metatarsal fractures.  We discussed the risks, benefits and alternatives of surgery which include but are not limited to wound healing complications,  infection, nonunion, malunion, need for further surgery, damage to surrounding structures and continued pain.  They understand there is no guarantees to an acceptable outcome.  After weighing these risks they opted to proceed with surgery.     Erle Crocker, MD    11/02/2020 3:43 PM

## 2020-11-02 NOTE — Op Note (Signed)
Peggy Harris female 40 y.o. 11/02/2020  PreOperative Diagnosis: Right second metatarsal fracture Right third metatarsal fracture  PostOperative Diagnosis: Same  PROCEDURE: Open reduction internal fixation of right second metatarsal fracture Open reduction internal fixation of right third metatarsal fracture  SURGEON: Melony Overly, MD  ASSISTANT: None  ANESTHESIA: MAC with peripheral nerve block  FINDINGS: Displaced comminuted second third metatarsal fracture  IMPLANTS: 062 K wire x2  INDICATIONS:40 y.o. female sustained the above fractures and a fall.  There were displaced, shortened and bayoneted.  Given the amount of displacement she was indicated for surgery.   Patient understood the risks, benefits and alternatives to surgery which include but are not limited to wound healing complications, infection, nonunion, malunion, need for further surgery as well as damage to surrounding structures. They also understood the potential for continued pain in that there were no guarantees of acceptable outcome After weighing these risks the patient opted to proceed with surgery.  PROCEDURE: Patient was identified in the preoperative holding area.  The right foot was marked by myself.  Consent was signed by myself and the patient.  Block was performed by anesthesia in the preoperative holding area.  Patient was taken to the operative suite and placed supine on the operative table.  MAC anesthesia was induced without difficulty. Bump was placed under the operative hip and bone foam was used.  All bony prominences were well padded.  Preoperative antibiotics were given. The extremity was prepped and draped in the usual sterile fashion and surgical timeout was performed.  Forge Esmarch tourniquet was placed about the ankle.  Incision was made overlying the area of the fractures between the second and third metatarsals.  This taken sharply down through skin and subcutaneous tissue.   Extensor tendons were identified and the sheath overlying them was incised in line with the incision.  The extensor tendons were mobilized and retracted.  The incision was carried down to the 2nd metatarsal.  We are able to identify the fracture site.  There was comminution, shortening and bayoneting of the fracture site.  There was robust callus formation around it that was removed with a rondure.  Fracture site was mobilized fully and under direct visualization we will to get it reduced into a better position.  K wire was placed within the distal fragment from proximal to distal at the bottom of the foot.  It was then run in a retrograde fashion up the shaft of the remaining metatarsal to the base of second metatarsal holding the reduction.  Fluoroscopy confirmed appropriate position of the K wire and reduction of the fracture.  We then turned our attention to the third metatarsal.  The extensor tendons were mobilized and retracted.  The incision was carried down to the 3rd metatarsal.  We are able to identify the fracture site.  There was comminution, shortening and bayoneting of the fracture site.  There was robust callus formation around it that was removed with a rondure.  Fracture site was mobilized fully and under direct visualization we will to get it reduced into a better position.  K wire was placed within the distal fragment from proximal to distal at the bottom of the foot.  It was then run in a retrograde fashion up the shaft of the remaining metatarsal to the base of second metatarsal holding the reduction.  Fluoroscopy confirmed appropriate position of the K wire and reduction of the fracture.  Final fluoroscopic images were obtained.  Wound was then irrigated and closed in  a layered fashion using 3-0 Monocryl and 3-0 nylon suture.  Tourniquet was released.  Soft dressing was placed.  She was awakened from anesthesia and taken recovery in stable condition.  POST OPERATIVE  INSTRUCTIONS: Heel weightbearing CAM Walker boot Keep dressing in place Follow-up in 2 weeks for suture removal and x-rays, nonweightbearing of the operative foot.   BLOOD LOSS:  Minimal         DRAINS: none         SPECIMEN: none       COMPLICATIONS:  * No complications entered in OR log *         Disposition: PACU - hemodynamically stable.         Condition: stable

## 2020-11-02 NOTE — Transfer of Care (Signed)
Immediate Anesthesia Transfer of Care Note  Patient: Peggy Harris  Procedure(s) Performed: OPEN TREATMENT OF RIGHT SECOND AND THIRD METATARSAL FRACTURE AND SURGERY AS INDICATED (Right)  Patient Location: PACU  Anesthesia Type:General and Regional  Level of Consciousness: awake, alert , oriented and patient cooperative  Airway & Oxygen Therapy: Patient Spontanous Breathing and Patient connected to face mask oxygen  Post-op Assessment: Report given to RN, Post -op Vital signs reviewed and stable and Patient moving all extremities X 4  Post vital signs: Reviewed and stable  Last Vitals:  Vitals Value Taken Time  BP 109/70 11/02/20 1720  Temp    Pulse 92 11/02/20 1723  Resp 16 11/02/20 1723  SpO2 100 % 11/02/20 1723  Vitals shown include unvalidated device data.  Last Pain:  Vitals:   11/02/20 1500  TempSrc:   PainSc: 0-No pain      Patients Stated Pain Goal: 3 (93/81/82 [id number])  Complications: No notable events documented.

## 2020-11-02 NOTE — Anesthesia Procedure Notes (Signed)
Procedure Name: MAC Date/Time: 11/02/2020 4:23 PM Performed by: Annamary Carolin, CRNA Pre-anesthesia Checklist: Patient identified, Emergency Drugs available, Suction available, Patient being monitored and Timeout performed Patient Re-evaluated:Patient Re-evaluated prior to induction Oxygen Delivery Method: Simple face mask Preoxygenation: Pre-oxygenation with 100% oxygen Induction Type: IV induction Dental Injury: Teeth and Oropharynx as per pre-operative assessment

## 2020-11-02 NOTE — Discharge Instructions (Signed)
DR. Raley Novicki FOOT [facility] ANKLE SURGERY POST-OP INSTRUCTIONS   Pain Management The numbing medicine and your leg will last around 18 hours, take a dose of your pain medicine as soon as you feel it wearing off to avoid rebound pain. Keep your foot elevated above heart level.  Make sure that your heel hangs free ('floats'). Take all prescribed medication as directed. If taking narcotic pain medication you may want to use an over-the-counter stool softener to avoid constipation. You may take over-the-counter NSAIDs (ibuprofen, naproxen, etc.) as well as over-the-counter acetaminophen as directed on the packaging as a supplement for your pain and may also use it to wean away from the prescription medication.  Activity Heel WB in post operative shoe. Keep dressing in place  First Postoperative Visit Your first postop visit will be at least 2 weeks after surgery.  This should be scheduled when you schedule surgery. If you do not have a postoperative visit scheduled please call [phone] to schedule an appointment. At the appointment your incision will be evaluated for suture removal, x-rays will be obtained if necessary.  General Instructions Swelling is very common after foot and ankle surgery.  It often takes 3 months for the foot and ankle to begin to feel comfortable.  Some amount of swelling will persist for 6-12 months. DO NOT change the dressing.  If there is a problem with the dressing (too tight, loose, gets wet, etc.) please contact Dr. Jamario Colina's office. DO NOT get the dressing wet.  For showers you can use an over-the-counter cast cover or wrap a washcloth around the top of your dressing and then cover it with a plastic bag and tape it to your leg. DO NOT soak the incision (no tubs, pools, bath, etc.) until you have approval from Dr. Rosalena Mccorry.  Contact Dr. Adairs office or go to Emergency Room if: Temperature above 101 F. Increasing pain that is unresponsive to pain medication or  elevation Excessive redness or swelling in your foot Dressing problems - excessive bloody drainage, looseness or tightness, or if dressing gets wet Develop pain, swelling, warmth, or discoloration of your calf  

## 2020-11-03 ENCOUNTER — Encounter (HOSPITAL_COMMUNITY): Payer: Self-pay | Admitting: Orthopaedic Surgery

## 2020-11-03 NOTE — Anesthesia Postprocedure Evaluation (Signed)
Anesthesia Post Note  Patient: Peggy Harris  Procedure(s) Performed: OPEN TREATMENT OF RIGHT SECOND AND THIRD METATARSAL FRACTURE AND SURGERY AS INDICATED (Right)     Patient location during evaluation: PACU Anesthesia Type: Regional and MAC Level of consciousness: awake and alert Pain management: pain level controlled Vital Signs Assessment: post-procedure vital signs reviewed and stable Respiratory status: spontaneous breathing, nonlabored ventilation, respiratory function stable and patient connected to nasal cannula oxygen Cardiovascular status: stable and blood pressure returned to baseline Postop Assessment: no apparent nausea or vomiting Anesthetic complications: no   No notable events documented.  Last Vitals:  Vitals:   11/02/20 1735 11/02/20 1745  BP: 110/68 103/72  Pulse: 85 93  Resp: 13 19  Temp:  36.8 C  SpO2: 98% 97%    Last Pain:  Vitals:   11/02/20 1745  TempSrc:   PainSc: 0-No pain                 Marjani Kobel S

## 2020-12-07 ENCOUNTER — Encounter: Payer: Self-pay | Admitting: Psychiatry

## 2020-12-07 ENCOUNTER — Ambulatory Visit: Payer: Medicaid Other | Admitting: Psychiatry

## 2020-12-07 NOTE — Progress Notes (Deleted)
[facility]  PATIENT: Peggy Harris DOB: 1980-12-17  REFERRING CLINICIAN: Terald Sleeper, PA-C HISTORY FROM: *** REASON FOR VISIT: memory loss, headaches   HISTORICAL  CHIEF COMPLAINT:  No chief complaint on file.   HISTORY OF PRESENT ILLNESS:  The patient presents for evaluation of memory loss and headaches which have been present since***  TBI: *** No past history of TBI Stroke: *** no past history of stroke Seizures: *** no past history of seizures Sleep: *** no history of sleep apnea.  Has *** never had sleep study.  STOP BANG score *** Mood: *** patient denies anxiety and depression  Functional status: independent in all ** ADLs and IADLs Patient lives with *** in a *** with *** stairs. Cooking: *** Cleaning: *** Shopping: *** Bathing: *** Toileting: *** Driving: *** Bills: *** Medications: *** Ever left the stove on by accident?: *** Forget how to use items around the house?: *** Getting lost going to familiar places?: *** Forgetting loved ones names?: *** Word finding difficulty? *** Sleep: ***  OTHER MEDICAL CONDITIONS: ***   REVIEW OF SYSTEMS: Full 14 system review of systems performed and negative with exception of: ***  ALLERGIES: Allergies  Allergen Reactions   Shellfish Allergy Rash   Ambien [Zolpidem] Other (See Comments)    (Ambien) insomnia Heart palpitations  (Ambien) insomnia Heart palpitations     Duloxetine Other (See Comments)    Increased depression (Cymbalta)    Paxil [Paroxetine Hcl]     Other reaction(s): Other (See Comments) (Paxil) Heart palpitations    Quetiapine Other (See Comments) and Palpitations    nightmares   Remeron [Mirtazapine] Palpitations    Other reaction(s): Other (See Comments) Heart Palpitations, drowsy Heart Palpitations, drowsy    Trazodone Palpitations    HOME MEDICATIONS: Outpatient Medications Prior to Visit  Medication Sig Dispense Refill    carboxymethylcellul-glycerin (OPTIVE) 0.5-0.9 % ophthalmic solution Place 1-2 drops into both eyes 3 (three) times daily as needed (dry/irritated eyes.).     dicyclomine (BENTYL) 10 MG capsule Take 10 mg by mouth 4 (four) times daily -  before meals and at bedtime.     esomeprazole (NEXIUM) 40 MG capsule Take 40 mg by mouth daily as needed (indigestion/heartburn.).     famotidine (PEPCID) 40 MG tablet Take 40 mg by mouth at bedtime as needed for nausea.     gabapentin (NEURONTIN) 300 MG capsule Take 300-600 mg by mouth 3 (three) times daily as needed (nerve pain.).     hydrochlorothiazide (HYDRODIURIL) 25 MG tablet Take 25 mg by mouth daily as needed (fluid retention).     hydrOXYzine (VISTARIL) 25 MG capsule Take 25 mg by mouth 3 (three) times daily as needed.     ibuprofen (ADVIL) 200 MG tablet Take 800 mg by mouth every 8 (eight) hours as needed (for pain.).     methocarbamol (ROBAXIN) 750 MG tablet Take 750 mg by mouth 3 (three) times daily as needed for muscle spasms.     oxyCODONE-acetaminophen (PERCOCET) 10-325 MG tablet Take 1 tablet by mouth every 4 (four) hours as needed for pain.     promethazine (PHENERGAN) 12.5 MG tablet Take 12.5-25 mg by mouth 3 (three) times daily as needed for nausea/vomiting.     rizatriptan (MAXALT-MLT) 10 MG disintegrating tablet Take 10 mg by mouth daily as needed for migraine.     sertraline (ZOLOFT) 100 MG tablet Take 100 mg by mouth in the morning.     No facility-administered medications prior to visit.  PAST MEDICAL HISTORY: Past Medical History:  Diagnosis Date   Anxiety    Arthritis    Cerebral palsy ([place])    walker for ambulation   DDD (degenerative disc disease), lumbar    Depression    GERD (gastroesophageal reflux disease)    IBS (irritable bowel syndrome)    Insomnia disorder 07/28/2019   Migraine    Status post dilation of esophageal narrowing     PAST SURGICAL HISTORY: Past Surgical History:  Procedure Laterality Date    ABDOMINAL HYSTERECTOMY  10/2018   BACK SURGERY  06/16/2020   L4-5 TLIF Revision at [place]     x2 - 2006, 2014   CORNEAL TRANSPLANT     2009, 2010   LAPAROSCOPIC OOPHERECTOMY Right 2020   LUMBAR FUSION     X2 2007 and 2019 L4-L5   REPAIR EXTENSOR TENDON WITH METATARSAL OSTEOTOMY AND OPEN REDUCTION IN Right 11/02/2020   Procedure: OPEN TREATMENT OF RIGHT SECOND AND THIRD METATARSAL FRACTURE AND SURGERY AS INDICATED;  Surgeon: Erle Crocker, MD;  Location: [facility];  Service: Orthopedics;  Laterality: Right;    FAMILY HISTORY: Family History  Problem Relation Age of Onset   Congestive Heart Failure Mother    Stroke Mother    Skin cancer Mother    Clotting disorder Mother    Leukemia Father    Alzheimer's disease Father    Breast cancer Maternal Aunt     SOCIAL HISTORY: Social History   Socioeconomic History   Marital status: Married    Spouse name: Not on file   Number of children: 3   Years of education: Not on file   Highest education level: Not on file  Occupational History   Not on file  Tobacco Use   Smoking status: Never   Smokeless tobacco: Never  Vaping Use   Vaping Use: Every day   Substances: Nicotine, Flavoring  Substance and Sexual Activity   Alcohol use: Yes    Alcohol/week: 14.0 standard drinks    Types: 14 Standard drinks or equivalent per week    Comment: 2 per day wine/liquor   Drug use: Never   Sexual activity: Yes    Birth control/protection: Surgical    Comment: Hysterectomy  Other Topics Concern   Not on file  Social History Narrative   Not on file   Social Determinants of Health   Financial Resource Strain: Not on file  Food Insecurity: Not on file  Transportation Needs: Not on file  Physical Activity: Not on file  Stress: Not on file  Social Connections: Not on file  Intimate Partner Violence: Not on file     PHYSICAL EXAM ***  GENERAL EXAM/CONSTITUTIONAL: Vitals: There were no vitals filed for  this visit. There is no height or weight on file to calculate BMI. Wt Readings from Last 3 Encounters:  11/02/20 175 lb (79.4 kg)  05/25/20 179 lb 8 oz (81.4 kg)  01/07/20 160 lb (72.6 kg)   Patient is in no distress; well developed, nourished and groomed; neck is supple  CARDIOVASCULAR: Examination of carotid arteries is normal; no carotid bruits Regular rate and rhythm, no murmurs Examination of peripheral vascular system by observation and palpation is normal  EYES: Pupils round and reactive to light, Visual fields full to confrontation, Extraocular movements intacts,   MUSCULOSKELETAL: Gait, strength, tone, movements noted in Neurologic exam below  NEUROLOGIC: MENTAL STATUS:  No flowsheet data found. awake, alert, oriented to person, place and time recent  and remote memory intact normal attention and concentration language fluent, comprehension intact, naming intact fund of knowledge appropriate  CRANIAL NERVE:  2nd - no papilledema or hemorrhages on fundoscopic exam 2nd, 3rd, 4th, 6th - pupils equal and reactive to light, visual fields full to confrontation, extraocular muscles intact, no nystagmus 5th - facial sensation symmetric 7th - facial strength symmetric 8th - hearing intact 9th - palate elevates symmetrically, uvula midline 11th - shoulder shrug symmetric 12th - tongue protrusion midline  MOTOR:  normal bulk and tone, no cogwheeling, full strength in the BUE, BLE  SENSORY:  normal and symmetric to light touch, pinprick, temperature, vibration  COORDINATION:  finger-nose-finger, fine finger movements normal, no tremor  REFLEXES:  deep tendon reflexes present and symmetric  GAIT/STATION:  normal     DIAGNOSTIC DATA (LABS, IMAGING, TESTING) - I reviewed patient records, labs, notes, testing and imaging myself where available.  Lab Results  Component Value Date   WBC 6.0 11/02/2020   HGB 11.9 (L) 11/02/2020   HCT 38.9 11/02/2020   MCV 81.6  11/02/2020   PLT 328 11/02/2020      Component Value Date/Time   NA 137 03/29/2020 1932   K 4.4 03/29/2020 1932   CL 106 03/29/2020 1932   CO2 26 03/29/2020 1932   GLUCOSE 122 (H) 03/29/2020 1932   BUN 17 03/29/2020 1932   CREATININE 0.61 03/29/2020 1932   CALCIUM 8.9 03/29/2020 1932   GFRNONAA >60 03/29/2020 1932   No results found for: CHOL, HDL, LDLCALC, LDLDIRECT, TRIG, CHOLHDL No results found for: HGBA1C No results found for: VITAMINB12 No results found for: TSH  ***    ASSESSMENT AND PLAN  40 y.o. year old female with ***   No diagnosis found.    PLAN: - Labs: CBC, CMP, TSH, B12, RPR  - MRI brain to assess for signs of neurodegeneration and/or significant vascular disease.  - Will place referral for neuropsychological testing to better characterize his/her reported deficits and to establish a cognitive baseline.  - Referral placed for cognitive rehabilitation to help with some of his/her issues.  - Follow up after testing is complete.   No orders of the defined types were placed in this encounter.   No orders of the defined types were placed in this encounter.   No follow-ups on file.  I spent an average of *** chart reviewing and counseling the patient, with at least 50% of the time face to face with the patient. General brain health measures discussed, including the importance of regular aerobic exercise. Reviewed safety measures including driving safety.   Genia Harold, MD  [facility] [address] [place], [place] [address] [phone]

## 2020-12-20 ENCOUNTER — Other Ambulatory Visit (HOSPITAL_COMMUNITY): Payer: Self-pay | Admitting: Orthopaedic Surgery

## 2020-12-20 ENCOUNTER — Ambulatory Visit (HOSPITAL_COMMUNITY): Admission: RE | Admit: 2020-12-20 | Payer: Medicaid Other | Source: Ambulatory Visit

## 2020-12-20 DIAGNOSIS — M79669 Pain in unspecified lower leg: Secondary | ICD-10-CM

## 2020-12-29 ENCOUNTER — Ambulatory Visit (HOSPITAL_COMMUNITY): Admission: RE | Admit: 2020-12-29 | Payer: Medicaid Other | Source: Ambulatory Visit

## 2020-12-31 ENCOUNTER — Encounter (HOSPITAL_COMMUNITY): Payer: Self-pay

## 2020-12-31 ENCOUNTER — Other Ambulatory Visit: Payer: Self-pay

## 2020-12-31 ENCOUNTER — Emergency Department (HOSPITAL_COMMUNITY): Payer: Medicaid Other

## 2020-12-31 ENCOUNTER — Emergency Department (HOSPITAL_COMMUNITY)
Admission: EM | Admit: 2020-12-31 | Discharge: 2020-12-31 | Disposition: A | Discharge status: Home / Self Care | Payer: Medicaid Other | Attending: Emergency Medicine | Admitting: Emergency Medicine

## 2020-12-31 DIAGNOSIS — L02415 Cutaneous abscess of right lower limb: Secondary | ICD-10-CM | POA: Insufficient documentation

## 2020-12-31 DIAGNOSIS — M7989 Other specified soft tissue disorders: Secondary | ICD-10-CM | POA: Diagnosis present

## 2020-12-31 DIAGNOSIS — L03115 Cellulitis of right lower limb: Secondary | ICD-10-CM | POA: Insufficient documentation

## 2020-12-31 DIAGNOSIS — L0291 Cutaneous abscess, unspecified: Secondary | ICD-10-CM

## 2020-12-31 MED ORDER — HYDROCODONE-ACETAMINOPHEN 5-325 MG PO TABS
1.0000 | ORAL_TABLET | Freq: Once | ORAL | Status: AC
  Administered 2020-12-31: 14:00:00 1 via ORAL
  Filled 2020-12-31: qty 1

## 2020-12-31 MED ORDER — IBUPROFEN 400 MG PO TABS
600.0000 mg | ORAL_TABLET | Freq: Once | ORAL | Status: AC
  Administered 2020-12-31: 14:00:00 600 mg via ORAL
  Filled 2020-12-31: qty 1

## 2020-12-31 MED ORDER — SULFAMETHOXAZOLE-TRIMETHOPRIM 800-160 MG PO TABS: 1.0000 | ORAL_TABLET | Freq: Two times a day (BID) | ORAL | 0 refills | Status: AC

## 2020-12-31 MED ORDER — LIDOCAINE-EPINEPHRINE (PF) 2 %-1:200000 IJ SOLN
10.0000 mL | Freq: Once | INTRAMUSCULAR | Status: AC
  Administered 2020-12-31: 10 mL
  Filled 2020-12-31: qty 20

## 2020-12-31 MED ORDER — SULFAMETHOXAZOLE-TRIMETHOPRIM 800-160 MG PO TABS
1.0000 | ORAL_TABLET | Freq: Once | ORAL | Status: AC
  Administered 2020-12-31: 14:00:00 1 via ORAL
  Filled 2020-12-31: qty 1

## 2020-12-31 NOTE — Discharge Instructions (Signed)
Take your normal home pain medications as needed for pain.  Return for increased redness or swelling.

## 2020-12-31 NOTE — ED Triage Notes (Signed)
Pt BIB [facility] from home c/o right foot pain. Pt noticed swelling that started 5 days ago. Pt also c/o discoloration of the right foot as well. Pt had foot surgery about a month ago. Pt's right foot is warm to touch, red and swollen.

## 2020-12-31 NOTE — ED Provider Notes (Signed)
[facility] EMERGENCY DEPARTMENT Provider Note   CSN: [id number] Arrival date & time: 12/31/20  1236     History Chief Complaint  Patient presents with   Foot Pain    Peggy Harris is a 40 y.o. female.  Pt presents to the ED today with right foot pain and swelling.  Pt had a fracture of her right 2nd and 3rd metatarsal fx which were displaced.  She had surgery on 10/27.  She had the pins removed a few weeks ago.  Pt initially said she has had redness and swelling to the right food for the past 5 days, but then said it was for 1-2 weeks  The pt is still wearing a cam walker.  No f/c.       Past Medical History:  Diagnosis Date   Anxiety    Arthritis    Cerebral palsy ([place])    walker for ambulation   DDD (degenerative disc disease), lumbar    Depression    GERD (gastroesophageal reflux disease)    IBS (irritable bowel syndrome)    Insomnia disorder 07/28/2019   Migraine    Status post dilation of esophageal narrowing     Patient Active Problem List   Diagnosis Date Noted   Migraine    Insomnia    IBS (irritable bowel syndrome)    GERD (gastroesophageal reflux disease)    Depression    DDD (degenerative disc disease), lumbar    Cerebral palsy ([place])    Anxiety    Back pain 07/28/2019   Insomnia disorder 07/28/2019    Past Surgical History:  Procedure Laterality Date   ABDOMINAL HYSTERECTOMY  10/2018   BACK SURGERY  06/16/2020   L4-5 TLIF Revision at [place]     x2 - 2006, 2014   CORNEAL TRANSPLANT     2009, 2010   LAPAROSCOPIC OOPHERECTOMY Right 2020   LUMBAR FUSION     X2 2007 and 2019 L4-L5   REPAIR EXTENSOR TENDON WITH METATARSAL OSTEOTOMY AND OPEN REDUCTION IN Right 11/02/2020   Procedure: OPEN TREATMENT OF RIGHT SECOND AND THIRD METATARSAL FRACTURE AND SURGERY AS INDICATED;  Surgeon: Erle Crocker, MD;  Location: [facility];  Service: Orthopedics;  Laterality: Right;     OB History   No obstetric  history on file.     Family History  Problem Relation Age of Onset   Congestive Heart Failure Mother    Stroke Mother    Skin cancer Mother    Clotting disorder Mother    Leukemia Father    Alzheimer's disease Father    Breast cancer Maternal Aunt     Social History   Tobacco Use   Smoking status: Never   Smokeless tobacco: Never  Vaping Use   Vaping Use: Every day   Substances: Nicotine, Flavoring  Substance Use Topics   Alcohol use: Yes    Alcohol/week: 14.0 standard drinks    Types: 14 Standard drinks or equivalent per week    Comment: 2 per day wine/liquor   Drug use: Never    Home Medications Prior to Admission medications   Medication Sig Start Date End Date Taking? Authorizing Provider  sulfamethoxazole-trimethoprim (BACTRIM DS) 800-160 MG tablet Take 1 tablet by mouth 2 (two) times daily for 7 days. 12/31/20 01/07/21 Yes Isla Pence, MD  carboxymethylcellul-glycerin (OPTIVE) 0.5-0.9 % ophthalmic solution Place 1-2 drops into both eyes 3 (three) times daily as needed (dry/irritated eyes.).    [provider]  dicyclomine (BENTYL) 10 MG capsule Take 10 mg by mouth 4 (four) times daily -  before meals and at bedtime.    [provider]  esomeprazole (NEXIUM) 40 MG capsule Take 40 mg by mouth daily as needed (indigestion/heartburn.).    [provider]  famotidine (PEPCID) 40 MG tablet Take 40 mg by mouth at bedtime as needed for nausea. 10/25/20   [provider]  gabapentin (NEURONTIN) 300 MG capsule Take 300-600 mg by mouth 3 (three) times daily as needed (nerve pain.).    [provider]  hydrochlorothiazide (HYDRODIURIL) 25 MG tablet Take 25 mg by mouth daily as needed (fluid retention).    [provider]  hydrOXYzine (VISTARIL) 25 MG capsule Take 25 mg by mouth 3 (three) times daily as needed.    [provider]  ibuprofen (ADVIL) 200 MG tablet Take 800 mg by mouth every 8 (eight) hours as needed  (for pain.).    [provider]  methocarbamol (ROBAXIN) 750 MG tablet Take 750 mg by mouth 3 (three) times daily as needed for muscle spasms.    [provider]  oxyCODONE-acetaminophen (PERCOCET) 10-325 MG tablet Take 1 tablet by mouth every 4 (four) hours as needed for pain.    [provider]  promethazine (PHENERGAN) 12.5 MG tablet Take 12.5-25 mg by mouth 3 (three) times daily as needed for nausea/vomiting. 09/29/20   [provider]  rizatriptan (MAXALT-MLT) 10 MG disintegrating tablet Take 10 mg by mouth daily as needed for migraine. 10/06/20   [provider]  sertraline (ZOLOFT) 100 MG tablet Take 100 mg by mouth in the morning.    [provider]    Allergies    Shellfish allergy, Ambien [zolpidem], Duloxetine, Paxil [paroxetine hcl], Quetiapine, Remeron [mirtazapine], and Trazodone  Review of Systems   Review of Systems  Neurological:        Right foot with abscess and cellulitis to the dorsum of the foot  All other systems reviewed and are negative.  Physical Exam Updated Vital Signs BP 118/81    Pulse 74    Temp 98.2 F (36.8 C) (Oral)    Resp 15    SpO2 100%   Physical Exam Vitals and nursing note reviewed.  Constitutional:      Appearance: Normal appearance.  HENT:     Head: Normocephalic and atraumatic.     Right Ear: External ear normal.     Left Ear: External ear normal.     Nose: Nose normal.     Mouth/Throat:     Mouth: Mucous membranes are moist.     Pharynx: Oropharynx is clear.  Eyes:     Comments: Disconjugate gaze (chronic)  Cardiovascular:     Rate and Rhythm: Normal rate and regular rhythm.     Pulses: Normal pulses.     Heart sounds: Normal heart sounds.  Pulmonary:     Effort: Pulmonary effort is normal.     Breath sounds: Normal breath sounds.  Abdominal:     General: Abdomen is flat. Bowel sounds are normal.     Palpations: Abdomen is soft.  Musculoskeletal:        General: Swelling  present. Normal range of motion.     Cervical back: Normal range of motion and neck supple.  Skin:    Capillary Refill: Capillary refill takes less than 2 seconds.     Comments: Right foot cellulitis and abscess.  See picture.  Neurological:     General: No focal  deficit present.     Mental Status: She is alert and oriented to person, place, and time.  Psychiatric:        Mood and Affect: Mood normal.        Behavior: Behavior normal.     ED Results / Procedures / Treatments   Labs (all labs ordered are listed, but only abnormal results are displayed) Labs Reviewed - No data to display  EKG None  Radiology DG Foot Complete Right  Result Date: 12/31/2020 CLINICAL DATA:  Pain and swelling, started 5 days ago, foot surgery a month ago EXAM: RIGHT FOOT COMPLETE - 3+ VIEW COMPARISON:  10/18/2020 FINDINGS: There are mildly displaced, oblique fractures of the distal right second and third metatarsals, now subacute appearing with some evidence of callus formation, seen acutely on prior examination dated 10/18/2020. No new fractures. Diffuse soft tissue edema about the included foot and ankle. IMPRESSION: 1. There are mildly displaced, oblique fractures of the distal right second and third metatarsals, now subacute appearing with some evidence of callus formation, seen acutely on prior examination dated 10/18/2020. No new fractures. 2. Diffuse soft tissue edema about the included right foot and ankle. Electronically Signed   By: Delanna Ahmadi M.D.   On: 12/31/2020 13:41    Procedures .Marland KitchenIncision and Drainage  Date/Time: 12/31/2020 1:59 PM Performed by: Isla Pence, MD Authorized by: Isla Pence, MD   Consent:    Consent obtained:  Verbal   Consent given by:  Patient   Risks discussed:  Incomplete drainage, bleeding and pain Universal protocol:    Patient identity confirmed:  Verbally with patient Location:    Type:  Abscess   Location:  Lower extremity   Lower extremity  location:  Foot   Foot location:  R foot Pre-procedure details:    Skin preparation:  Povidone-iodine Sedation:    Sedation type:  None Anesthesia:    Anesthesia method:  Local infiltration   Local anesthetic:  Lidocaine 2% WITH epi Procedure type:    Complexity:  Simple Procedure details:    Incision types:  Cruciate   Wound management:  Probed and deloculated   Drainage:  Purulent   Drainage amount:  Scant Post-procedure details:    Procedure completion:  Tolerated well, no immediate complications   Medications Ordered in ED Medications  HYDROcodone-acetaminophen (NORCO/VICODIN) 5-325 MG per tablet 1 tablet (1 tablet Oral Given 12/31/20 1334)  ibuprofen (ADVIL) tablet 600 mg (600 mg Oral Given 12/31/20 1334)  sulfamethoxazole-trimethoprim (BACTRIM DS) 800-160 MG per tablet 1 tablet (1 tablet Oral Given 12/31/20 1334)  lidocaine-EPINEPHrine (XYLOCAINE W/EPI) 2 %-1:200000 (PF) injection 10 mL (10 mLs Infiltration Given by Other 12/31/20 1334)    ED Course  I have reviewed the triage vital signs and the nursing notes.  Pertinent labs & imaging results that were available during my care of the patient were reviewed by me and considered in my medical decision making (see chart for details).    MDM Rules/Calculators/A&P                         Pt instructed to return for increased redness or swelling.  F/u with pcp.  She takes home percocet 10s.  She is instructed to take that for pain.  She is d/c with bactrim.   Final Clinical Impression(s) / ED Diagnoses Final diagnoses:  Abscess  Cellulitis of right lower extremity    Rx / DC Orders ED Discharge Orders  Ordered    sulfamethoxazole-trimethoprim (BACTRIM DS) 800-160 MG tablet  2 times daily        12/31/20 1358             Isla Pence, MD 12/31/20 1401

## 2021-01-02 ENCOUNTER — Encounter (HOSPITAL_COMMUNITY): Payer: Self-pay

## 2021-01-05 ENCOUNTER — Ambulatory Visit (HOSPITAL_COMMUNITY)
Admission: RE | Admit: 2021-01-05 | Payer: Medicaid Other | Source: Ambulatory Visit | Attending: Orthopaedic Surgery | Admitting: Orthopaedic Surgery

## 2021-01-09 ENCOUNTER — Ambulatory Visit: Payer: Medicaid Other | Admitting: Psychiatry

## 2021-01-09 ENCOUNTER — Encounter: Payer: Self-pay | Admitting: Psychiatry
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
## 2021-01-12 ENCOUNTER — Ambulatory Visit (HOSPITAL_COMMUNITY)
Admission: RE | Admit: 2021-01-12 | Payer: Medicaid Other | Source: Ambulatory Visit | Attending: Orthopaedic Surgery | Admitting: Orthopaedic Surgery

## 2021-01-23 ENCOUNTER — Other Ambulatory Visit: Payer: Self-pay | Admitting: Orthopaedic Surgery

## 2021-01-23 DIAGNOSIS — M79671 Pain in right foot: Secondary | ICD-10-CM

## 2021-01-25 ENCOUNTER — Emergency Department (HOSPITAL_COMMUNITY)
Admission: EM | Admit: 2021-01-25 | Discharge: 2021-01-25 | Disposition: A | Discharge status: Home / Self Care | Payer: Medicaid Other | Attending: Emergency Medicine | Admitting: Emergency Medicine

## 2021-01-25 ENCOUNTER — Encounter (HOSPITAL_COMMUNITY): Payer: Self-pay | Admitting: Emergency Medicine

## 2021-01-25 ENCOUNTER — Other Ambulatory Visit: Payer: Self-pay

## 2021-01-25 ENCOUNTER — Emergency Department (HOSPITAL_COMMUNITY): Payer: Medicaid Other

## 2021-01-25 DIAGNOSIS — Z79899 Other long term (current) drug therapy: Secondary | ICD-10-CM | POA: Insufficient documentation

## 2021-01-25 DIAGNOSIS — S99912A Unspecified injury of left ankle, initial encounter: Secondary | ICD-10-CM | POA: Diagnosis present

## 2021-01-25 DIAGNOSIS — W010XXA Fall on same level from slipping, tripping and stumbling without subsequent striking against object, initial encounter: Secondary | ICD-10-CM | POA: Insufficient documentation

## 2021-01-25 DIAGNOSIS — S8262XA Displaced fracture of lateral malleolus of left fibula, initial encounter for closed fracture: Secondary | ICD-10-CM | POA: Diagnosis not present

## 2021-01-25 MED ORDER — IBUPROFEN 400 MG PO TABS
400.0000 mg | ORAL_TABLET | Freq: Once | ORAL | Status: AC | PRN
  Administered 2021-01-25: 400 mg via ORAL
  Filled 2021-01-25: qty 1

## 2021-01-25 MED ORDER — OXYCODONE-ACETAMINOPHEN 5-325 MG PO TABS
2.0000 | ORAL_TABLET | Freq: Once | ORAL | Status: AC
  Administered 2021-01-25: 2 via ORAL
  Filled 2021-01-25: qty 2

## 2021-01-25 NOTE — ED Triage Notes (Signed)
Patient here with complaint of left ankle pain that started after falling last night. Patient states she has a history of balance problems and that is what caused her fall. Patient alert, oriented and in no apparent distress at this time.

## 2021-01-25 NOTE — Progress Notes (Signed)
°   01/25/21 1304  TOC ED Mini Assessment  TOC Time spent with patient (minutes): 30  TOC Time saved using PING (minutes): 20  PING Used in TOC Assessment Yes  Admission or Readmission Diverted Yes  Interventions which prevented an admission or readmission DME Provided  What brought you to the Emergency Department?  ankle pain  Barriers to Discharge ED DME delivery  Barrier interventions ordered DME thorugh [facility] of departure Car  Patient states their goals for this hospitalization and ongoing recovery are: have my foot checked out   Fuller Mandril, RN, BSN, [place] [phone] Pt qualifies for DME (Durable Medical Equipment) wheelchair.  DME  ordered through [facility].  Freda Munro  of [facility] notified to deliver DME to pt room prior to D/C home.

## 2021-01-25 NOTE — Discharge Planning (Signed)
°  °  Durable Medical Equipment  (From admission, onward)           Start     Ordered   01/25/21 0000  For home use only DME standard manual wheelchair with seat cushion       Comments: Patient suffers from right foot fractures and an acute left fibula fracture which impairs their ability to perform daily activities like bathing, dressing, and toileting in the home.  A cane, crutch, or walker will not resolve issue with performing activities of daily living. A wheelchair will allow patient to safely perform daily activities. Patient can safely propel the wheelchair in the home or has a caregiver who can provide assistance. Length of need 6 months. Accessories: elevating leg rests (ELRs), wheel locks, extensions and anti-tippers.   01/25/21 1303

## 2021-01-25 NOTE — ED Provider Notes (Signed)
[facility] EMERGENCY DEPARTMENT Provider Note   CSN: [id number] Arrival date & time: 01/25/21  1044     History  Chief Complaint  Patient presents with   Ankle Pain    Peggy Harris is a 41 y.o. female.  Patient who is less than 2 months post-op right second and third metatarsal surgery with Dr. Lucia Gaskins, history of lumbar surgery, history of chronic pain on oxycodone -- presents to the emergency department for left ankle pain after a fall late last night.  Patient states that she got up from bed and went to the bathroom around 11:30 PM.  She stumbled and twisted her left ankle.  She has had pain and swelling of the left lateral ankle.  She denies head or neck injury.  No treatments prior to arrival.      Home Medications Prior to Admission medications   Medication Sig Start Date End Date Taking? Authorizing Provider  carboxymethylcellul-glycerin (OPTIVE) 0.5-0.9 % ophthalmic solution Place 1-2 drops into both eyes 3 (three) times daily as needed (dry/irritated eyes.).    [provider]  dicyclomine (BENTYL) 10 MG capsule Take 10 mg by mouth 4 (four) times daily -  before meals and at bedtime.    [provider]  esomeprazole (NEXIUM) 40 MG capsule Take 40 mg by mouth daily as needed (indigestion/heartburn.).    [provider]  famotidine (PEPCID) 40 MG tablet Take 40 mg by mouth at bedtime as needed for nausea. 10/25/20   [provider]  gabapentin (NEURONTIN) 300 MG capsule Take 300-600 mg by mouth 3 (three) times daily as needed (nerve pain.).    [provider]  hydrochlorothiazide (HYDRODIURIL) 25 MG tablet Take 25 mg by mouth daily as needed (fluid retention).    [provider]  hydrOXYzine (VISTARIL) 25 MG capsule Take 25 mg by mouth 3 (three) times daily as needed.    [provider]  ibuprofen (ADVIL) 200 MG tablet Take 800 mg by mouth every 8 (eight) hours as needed (for pain.).     [provider]  methocarbamol (ROBAXIN) 750 MG tablet Take 750 mg by mouth 3 (three) times daily as needed for muscle spasms.    [provider]  oxyCODONE-acetaminophen (PERCOCET) 10-325 MG tablet Take 1 tablet by mouth every 4 (four) hours as needed for pain.    [provider]  promethazine (PHENERGAN) 12.5 MG tablet Take 12.5-25 mg by mouth 3 (three) times daily as needed for nausea/vomiting. 09/29/20   [provider]  rizatriptan (MAXALT-MLT) 10 MG disintegrating tablet Take 10 mg by mouth daily as needed for migraine. 10/06/20   [provider]  sertraline (ZOLOFT) 100 MG tablet Take 100 mg by mouth in the morning.    [provider]      Allergies    Shellfish allergy, Ambien [zolpidem], Duloxetine, Paxil [paroxetine hcl], Quetiapine, Remeron [mirtazapine], and Trazodone    Review of Systems   Review of Systems  Physical Exam Updated Vital Signs BP (!) 114/92 (BP Location: Right Arm)    Pulse (!) 107    Temp 99.7 F (37.6 C) (Oral)    Resp 18    SpO2 99%  Physical Exam Vitals and nursing note reviewed.  Constitutional:      Appearance: She is well-developed.  HENT:     Head: Normocephalic and atraumatic.  Eyes:     Pupils: Pupils are equal, round, and reactive to light.  Cardiovascular:     Pulses: Normal pulses.  No decreased pulses.  Musculoskeletal:        General: Tenderness present.     Cervical back: Normal range of motion and neck supple.     Comments: Right foot and ankle in cam walker  Left lower extremity: Patient with tenderness and swelling about the left lateral malleolus extending to the ankle.  Decreased range of motion of the left ankle.  No joint line tenderness to palpation over the knee.  No proximal fibula tenderness.  Skin:    General: Skin is warm and dry.  Neurological:     Mental Status: She is alert.     Sensory: No sensory deficit.     Comments: Motor, sensation, and vascular distal to the  injury is fully intact.   Psychiatric:        Mood and Affect: Mood normal.    ED Results / Procedures / Treatments   Labs (all labs ordered are listed, but only abnormal results are displayed) Labs Reviewed - No data to display  EKG None  Radiology DG Ankle Complete Left  Result Date: 01/25/2021 CLINICAL DATA:  Ankle injury with pain. EXAM: LEFT ANKLE COMPLETE - 3+ VIEW COMPARISON:  None. FINDINGS: Three views study shows a comminuted distal fibula fracture at the level of the ankle mortise. There may be some minimal widening of the medial tibiotalar joint. No associated fracture of the distal tibia evident. Prominent lateral soft tissue swelling. IMPRESSION: Comminuted distal fibula fracture with possible minimal widening of the medial tibiotalar joint. Electronically Signed   By: Misty Stanley M.D.   On: 01/25/2021 11:37    Procedures Procedures    Medications Ordered in ED Medications  ibuprofen (ADVIL) tablet 400 mg (400 mg Oral Given 01/25/21 1119)  oxyCODONE-acetaminophen (PERCOCET/ROXICET) 5-325 MG per tablet 2 tablet (2 tablets Oral Given 01/25/21 1231)    ED Course/ Medical Decision Making/ A&P    Patient seen and examined. History obtained directly from patient in addition to orthopedic clinic and operative notes in epic.  Work-up including labs, imaging, EKG ordered in triage, if performed, were reviewed.    Labs/EKG: None required  Imaging: Independently reviewed and interpreted.  This included: X-ray of the left ankle.  Agree with comminuted distal fibula fracture.  Medications/Fluids: Ordered: P.o. oxycodone.  Considered administration of: Parenteral narcotics however patient is relatively comfortable.  Most recent vital signs reviewed and are as follows: BP (!) 114/92 (BP Location: Right Arm)    Pulse (!) 107    Temp 99.7 F (37.6 C) (Oral)    Resp 18    SpO2 99%   Initial impression: Left distal fibula fracture, postop right foot  Patient will need a  wheelchair for mobility given the fact that she will have immobilization of both feet and ankles due to pre-existing right foot fractures and new left fibula fracture.  Will consult with case manager in regards to obtaining durable medical equipment wheelchair.   Patient suffers from right foot fractures and an acute left fibula fracture which impairs their ability to perform daily activities like bathing, dressing, and toileting in the home.  A cane, crutch, or walker will not resolve issue with performing activities of daily living. A wheelchair will allow patient to safely perform daily activities. Patient can safely propel the wheelchair in the home or has a caregiver who can provide assistance. Length of need 6 months. Accessories: elevating leg rests (ELRs), wheel locks, extensions and anti-tippers.  1:04 PM I spoke with case manager Fuller Mandril, RN who  is helping to arrange wheelchair for patient to use.   2:45 PM Patient to be discharged after wheelchair arrives.   She will continue home meds and is already established with orthopedics.   Patient was counseled on RICE protocol and told to rest injury, use ice for no longer than 15 minutes every hour, compress the area, and elevate above the level of their heart as much as possible to reduce swelling. Questions answered. Patient verbalized understanding.                            Medical Decision Making Amount and/or Complexity of Data Reviewed Radiology: ordered.  Risk Prescription drug management.   Patient with what sounds like a mechanical fall due to underlying mobility and instability issues.  No concern for head or neck injury.  Unfortunately, patient did sustain a left distal fibula fracture.  She is already established with orthopedics.  Lower extremity is neurovascularly intact in distal circulation, motor, and sensation are intact.  No knee pain or proximal fibular tenderness.  Encourage patient call her orthopedist later  today or tomorrow for appointment.        Final Clinical Impression(s) / ED Diagnoses Final diagnoses:  Closed avulsion fracture of lateral malleolus of left fibula, initial encounter    Rx / DC Orders ED Discharge Orders          Ordered    For home use only DME standard manual wheelchair with seat cushion       Comments: Patient suffers from right foot fractures and an acute left fibula fracture which impairs their ability to perform daily activities like bathing, dressing, and toileting in the home.  A cane, crutch, or walker will not resolve issue with performing activities of daily living. A wheelchair will allow patient to safely perform daily activities. Patient can safely propel the wheelchair in the home or has a caregiver who can provide assistance. Length of need 6 months. Accessories: elevating leg rests (ELRs), wheel locks, extensions and anti-tippers.   01/25/21 1303              Carlisle Cater, PA-C 01/25/21 [place], DO 01/25/21 [phone]

## 2021-01-25 NOTE — Discharge Instructions (Signed)
Please read and follow all provided instructions.  Your diagnoses today include:  1. Closed avulsion fracture of lateral malleolus of left fibula, initial encounter    Tests performed today include: An x-ray of the affected area - shows broken end of the fibula Vital signs. See below for your results today.   Medications prescribed:  None  Take any prescribed medications only as directed.  Home care instructions:  Follow any educational materials contained in this packet Use crutches or walker if you need to be up on your feet, otherwise use your wheelchair to help prevent falls. Follow R.I.C.E. Protocol: R - rest your injury  I  - use ice on injury without applying directly to skin C - compress injury with bandage or splint E - elevate the injury as much as possible  Follow-up instructions: Please follow-up with Dr. Lucia Gaskins next week. Please call office for an appointment.   Return instructions:  Please return if your toes or feet are numb or tingling, appear gray or blue, or you have severe pain (also elevate the leg and loosen splint or wrap if you were given one) Please return to the Emergency Department if you experience worsening symptoms.  Please return if you have any other emergent concerns.  Additional Information:  Your vital signs today were: BP (!) 114/92 (BP Location: Right Arm)    Pulse (!) 107    Temp 99.7 F (37.6 C) (Oral)    Resp 18    SpO2 99%  If your blood pressure (BP) was elevated above 135/85 this visit, please have this repeated by your doctor within one month. --------------

## 2021-01-31 ENCOUNTER — Other Ambulatory Visit: Payer: Self-pay | Admitting: Orthopaedic Surgery

## 2021-02-05 ENCOUNTER — Encounter (HOSPITAL_BASED_OUTPATIENT_CLINIC_OR_DEPARTMENT_OTHER): Payer: Self-pay | Admitting: Orthopaedic Surgery

## 2021-02-05 ENCOUNTER — Other Ambulatory Visit: Payer: Self-pay

## 2021-02-12 ENCOUNTER — Ambulatory Visit
Admission: RE | Admit: 2021-02-12 | Discharge: 2021-02-12 | Disposition: A | Discharge status: Home / Self Care | Payer: Medicaid Other | Source: Ambulatory Visit | Attending: Orthopaedic Surgery | Admitting: Orthopaedic Surgery

## 2021-02-12 ENCOUNTER — Encounter (HOSPITAL_BASED_OUTPATIENT_CLINIC_OR_DEPARTMENT_OTHER)
Admission: RE | Admit: 2021-02-12 | Discharge: 2021-02-12 | Disposition: A | Discharge status: Home / Self Care | Payer: Medicaid Other | Source: Ambulatory Visit | Attending: Orthopaedic Surgery | Admitting: Orthopaedic Surgery

## 2021-02-12 DIAGNOSIS — M1990 Unspecified osteoarthritis, unspecified site: Secondary | ICD-10-CM | POA: Diagnosis not present

## 2021-02-12 DIAGNOSIS — F32A Depression, unspecified: Secondary | ICD-10-CM | POA: Diagnosis not present

## 2021-02-12 DIAGNOSIS — Z01810 Encounter for preprocedural cardiovascular examination: Secondary | ICD-10-CM | POA: Insufficient documentation

## 2021-02-12 DIAGNOSIS — S93432A Sprain of tibiofibular ligament of left ankle, initial encounter: Secondary | ICD-10-CM | POA: Diagnosis not present

## 2021-02-12 DIAGNOSIS — F419 Anxiety disorder, unspecified: Secondary | ICD-10-CM | POA: Diagnosis not present

## 2021-02-12 DIAGNOSIS — K219 Gastro-esophageal reflux disease without esophagitis: Secondary | ICD-10-CM | POA: Diagnosis not present

## 2021-02-12 DIAGNOSIS — G809 Cerebral palsy, unspecified: Secondary | ICD-10-CM | POA: Diagnosis not present

## 2021-02-12 DIAGNOSIS — F17290 Nicotine dependence, other tobacco product, uncomplicated: Secondary | ICD-10-CM | POA: Diagnosis not present

## 2021-02-12 DIAGNOSIS — M79671 Pain in right foot: Secondary | ICD-10-CM

## 2021-02-12 DIAGNOSIS — W19XXXA Unspecified fall, initial encounter: Secondary | ICD-10-CM | POA: Diagnosis not present

## 2021-02-12 DIAGNOSIS — S8262XA Displaced fracture of lateral malleolus of left fibula, initial encounter for closed fracture: Secondary | ICD-10-CM | POA: Diagnosis not present

## 2021-02-12 DIAGNOSIS — D1723 Benign lipomatous neoplasm of skin and subcutaneous tissue of right leg: Secondary | ICD-10-CM | POA: Diagnosis not present

## 2021-02-12 LAB — BASIC METABOLIC PANEL
Anion gap: 12 (ref 5–15)
BUN: 12 mg/dL (ref 6–20)
CO2: 22 mmol/L (ref 22–32)
Calcium: 9.3 mg/dL (ref 8.9–10.3)
Chloride: 103 mmol/L (ref 98–111)
Creatinine, Ser: 0.63 mg/dL (ref 0.44–1.00)
GFR, Estimated: >60 mL/min (ref >60)
Glucose, Bld: 79 mg/dL (ref 70–99)
Potassium: 4.8 mmol/L (ref 3.5–5.1)
Sodium: 137 mmol/L (ref 135–145)

## 2021-02-12 MED ORDER — GADOBENATE DIMEGLUMINE 529 MG/ML IV SOLN
16.0000 mL | Freq: Once | INTRAVENOUS | Status: AC | PRN
  Administered 2021-02-12: 16 mL via INTRAVENOUS

## 2021-02-12 NOTE — Progress Notes (Signed)
Sent text reminding pt to come in for lab work today.

## 2021-02-12 NOTE — Progress Notes (Signed)

## 2021-02-13 ENCOUNTER — Ambulatory Visit (HOSPITAL_COMMUNITY): Payer: Medicaid Other

## 2021-02-13 ENCOUNTER — Encounter (HOSPITAL_BASED_OUTPATIENT_CLINIC_OR_DEPARTMENT_OTHER)
Admission: RE | Disposition: A | Discharge status: Home / Self Care | Payer: Self-pay | Source: Home / Self Care | Attending: Orthopaedic Surgery

## 2021-02-13 ENCOUNTER — Other Ambulatory Visit: Payer: Self-pay

## 2021-02-13 ENCOUNTER — Ambulatory Visit (HOSPITAL_BASED_OUTPATIENT_CLINIC_OR_DEPARTMENT_OTHER): Payer: Medicaid Other | Admitting: Anesthesiology

## 2021-02-13 ENCOUNTER — Encounter (HOSPITAL_BASED_OUTPATIENT_CLINIC_OR_DEPARTMENT_OTHER): Payer: Self-pay | Admitting: Orthopaedic Surgery

## 2021-02-13 ENCOUNTER — Ambulatory Visit (HOSPITAL_BASED_OUTPATIENT_CLINIC_OR_DEPARTMENT_OTHER)
Admission: RE | Admit: 2021-02-13 | Discharge: 2021-02-13 | Disposition: A | Discharge status: Home / Self Care | Payer: Medicaid Other | Attending: Orthopaedic Surgery | Admitting: Orthopaedic Surgery

## 2021-02-13 DIAGNOSIS — D1723 Benign lipomatous neoplasm of skin and subcutaneous tissue of right leg: Secondary | ICD-10-CM | POA: Insufficient documentation

## 2021-02-13 DIAGNOSIS — S8262XA Displaced fracture of lateral malleolus of left fibula, initial encounter for closed fracture: Secondary | ICD-10-CM | POA: Diagnosis not present

## 2021-02-13 DIAGNOSIS — F32A Depression, unspecified: Secondary | ICD-10-CM | POA: Insufficient documentation

## 2021-02-13 DIAGNOSIS — M1990 Unspecified osteoarthritis, unspecified site: Secondary | ICD-10-CM | POA: Insufficient documentation

## 2021-02-13 DIAGNOSIS — K219 Gastro-esophageal reflux disease without esophagitis: Secondary | ICD-10-CM | POA: Insufficient documentation

## 2021-02-13 DIAGNOSIS — F17290 Nicotine dependence, other tobacco product, uncomplicated: Secondary | ICD-10-CM | POA: Insufficient documentation

## 2021-02-13 DIAGNOSIS — F419 Anxiety disorder, unspecified: Secondary | ICD-10-CM | POA: Insufficient documentation

## 2021-02-13 DIAGNOSIS — S93432A Sprain of tibiofibular ligament of left ankle, initial encounter: Secondary | ICD-10-CM | POA: Insufficient documentation

## 2021-02-13 DIAGNOSIS — G809 Cerebral palsy, unspecified: Secondary | ICD-10-CM | POA: Insufficient documentation

## 2021-02-13 DIAGNOSIS — Z419 Encounter for procedure for purposes other than remedying health state, unspecified: Secondary | ICD-10-CM

## 2021-02-13 DIAGNOSIS — W19XXXA Unspecified fall, initial encounter: Secondary | ICD-10-CM | POA: Insufficient documentation

## 2021-02-13 HISTORY — PX: ORIF ANKLE FRACTURE: SHX5408

## 2021-02-13 HISTORY — PX: EXCISION MASS LOWER EXTREMETIES: SHX6705

## 2021-02-13 SURGERY — OPEN REDUCTION INTERNAL FIXATION (ORIF) ANKLE FRACTURE
Anesthesia: General | Site: Foot | Laterality: Right

## 2021-02-13 MED ORDER — FENTANYL CITRATE (PF) 100 MCG/2ML IJ SOLN
100.0000 ug | Freq: Once | INTRAMUSCULAR | Status: AC
  Administered 2021-02-13: 50 ug via INTRAVENOUS

## 2021-02-13 MED ORDER — PROMETHAZINE HCL 25 MG/ML IJ SOLN: 6.2500 mg | INTRAMUSCULAR | Status: DC | PRN

## 2021-02-13 MED ORDER — OXYCODONE HCL 5 MG PO TABS: 5.0000 mg | ORAL_TABLET | ORAL | 0 refills | Status: AC | PRN

## 2021-02-13 MED ORDER — OXYCODONE HCL 5 MG PO TABS: 5.0000 mg | ORAL_TABLET | Freq: Once | ORAL | Status: DC | PRN

## 2021-02-13 MED ORDER — ONDANSETRON HCL 4 MG/2ML IJ SOLN
INTRAMUSCULAR | Status: AC
  Filled 2021-02-13: qty 2

## 2021-02-13 MED ORDER — MIDAZOLAM HCL 2 MG/2ML IJ SOLN
INTRAMUSCULAR | Status: AC
  Filled 2021-02-13: qty 2

## 2021-02-13 MED ORDER — FENTANYL CITRATE (PF) 100 MCG/2ML IJ SOLN
INTRAMUSCULAR | Status: AC
  Filled 2021-02-13: qty 2

## 2021-02-13 MED ORDER — AMISULPRIDE (ANTIEMETIC) 5 MG/2ML IV SOLN: 10.0000 mg | Freq: Once | INTRAVENOUS | Status: DC | PRN

## 2021-02-13 MED ORDER — KETOROLAC TROMETHAMINE 30 MG/ML IJ SOLN
INTRAMUSCULAR | Status: DC | PRN
  Administered 2021-02-13: 30 mg via INTRAVENOUS

## 2021-02-13 MED ORDER — CEFAZOLIN SODIUM-DEXTROSE 2-4 GM/100ML-% IV SOLN
2.0000 g | INTRAVENOUS | Status: AC
  Administered 2021-02-13: 2 g via INTRAVENOUS

## 2021-02-13 MED ORDER — LACTATED RINGERS IV SOLN: INTRAVENOUS | Status: DC

## 2021-02-13 MED ORDER — MIDAZOLAM HCL 2 MG/2ML IJ SOLN
2.0000 mg | Freq: Once | INTRAMUSCULAR | Status: AC
  Administered 2021-02-13: 1 mg via INTRAVENOUS

## 2021-02-13 MED ORDER — SCOPOLAMINE 1 MG/3DAYS TD PT72
1.0000 | MEDICATED_PATCH | TRANSDERMAL | Status: DC
  Administered 2021-02-13: 1.5 mg via TRANSDERMAL

## 2021-02-13 MED ORDER — EPHEDRINE SULFATE (PRESSORS) 50 MG/ML IJ SOLN
INTRAMUSCULAR | Status: DC | PRN
  Administered 2021-02-13: 10 mg via INTRAVENOUS

## 2021-02-13 MED ORDER — EPHEDRINE 5 MG/ML INJ
INTRAVENOUS | Status: AC
  Filled 2021-02-13: qty 5

## 2021-02-13 MED ORDER — SCOPOLAMINE 1 MG/3DAYS TD PT72
MEDICATED_PATCH | TRANSDERMAL | Status: AC
  Filled 2021-02-13: qty 1

## 2021-02-13 MED ORDER — 0.9 % SODIUM CHLORIDE (POUR BTL) OPTIME
TOPICAL | Status: DC | PRN
Start: 2021-02-13 — End: 2021-02-13
  Administered 2021-02-13: 1000 mL

## 2021-02-13 MED ORDER — ROPIVACAINE HCL 5 MG/ML IJ SOLN
INTRAMUSCULAR | Status: DC | PRN
  Administered 2021-02-13: 30 mL via PERINEURAL

## 2021-02-13 MED ORDER — ACETAMINOPHEN 500 MG PO TABS
1000.0000 mg | ORAL_TABLET | Freq: Once | ORAL | Status: AC
  Administered 2021-02-13: 1000 mg via ORAL

## 2021-02-13 MED ORDER — DEXAMETHASONE SODIUM PHOSPHATE 10 MG/ML IJ SOLN
INTRAMUSCULAR | Status: DC | PRN
  Administered 2021-02-13: 4 mg via INTRAVENOUS

## 2021-02-13 MED ORDER — ONDANSETRON HCL 4 MG/2ML IJ SOLN
INTRAMUSCULAR | Status: DC | PRN
  Administered 2021-02-13: 4 mg via INTRAVENOUS

## 2021-02-13 MED ORDER — PROPOFOL 10 MG/ML IV BOLUS
INTRAVENOUS | Status: DC | PRN
  Administered 2021-02-13: 140 mg via INTRAVENOUS
  Administered 2021-02-13: 60 mg via INTRAVENOUS

## 2021-02-13 MED ORDER — FENTANYL CITRATE (PF) 100 MCG/2ML IJ SOLN
INTRAMUSCULAR | Status: DC | PRN
  Administered 2021-02-13: 25 ug via INTRAVENOUS
  Administered 2021-02-13: 50 ug via INTRAVENOUS
  Administered 2021-02-13 (×3): 25 ug via INTRAVENOUS
  Administered 2021-02-13: 50 ug via INTRAVENOUS

## 2021-02-13 MED ORDER — BUPIVACAINE HCL (PF) 0.5 % IJ SOLN
INTRAMUSCULAR | Status: AC
  Filled 2021-02-13: qty 30

## 2021-02-13 MED ORDER — OXYCODONE HCL 5 MG/5ML PO SOLN: 5.0000 mg | Freq: Once | ORAL | Status: DC | PRN

## 2021-02-13 MED ORDER — ACETAMINOPHEN 500 MG PO TABS
ORAL_TABLET | ORAL | Status: AC
  Filled 2021-02-13: qty 2

## 2021-02-13 MED ORDER — CEFAZOLIN SODIUM-DEXTROSE 2-4 GM/100ML-% IV SOLN
INTRAVENOUS | Status: AC
  Filled 2021-02-13: qty 100

## 2021-02-13 MED ORDER — ASPIRIN 325 MG PO TABS: 325.0000 mg | ORAL_TABLET | Freq: Every day | ORAL | 0 refills | Status: AC

## 2021-02-13 MED ORDER — LIDOCAINE HCL (CARDIAC) PF 100 MG/5ML IV SOSY
PREFILLED_SYRINGE | INTRAVENOUS | Status: DC | PRN
  Administered 2021-02-13: 60 mg via INTRATRACHEAL

## 2021-02-13 MED ORDER — BUPIVACAINE HCL (PF) 0.5 % IJ SOLN
INTRAMUSCULAR | Status: DC | PRN
Start: 2021-02-13 — End: 2021-02-13
  Administered 2021-02-13: 10 mL

## 2021-02-13 MED ORDER — PROPOFOL 10 MG/ML IV BOLUS
INTRAVENOUS | Status: AC
  Filled 2021-02-13: qty 20

## 2021-02-13 MED ORDER — FENTANYL CITRATE (PF) 100 MCG/2ML IJ SOLN
25.0000 ug | INTRAMUSCULAR | Status: DC | PRN
  Administered 2021-02-13: 50 ug via INTRAVENOUS

## 2021-02-13 SURGICAL SUPPLY — 80 items
APL PRP STRL LF DISP 70% ISPRP (MISCELLANEOUS) ×4
APL SKNCLS STERI-STRIP NONHPOA (GAUZE/BANDAGES/DRESSINGS)
BANDAGE ESMARK 6X9 LF (GAUZE/BANDAGES/DRESSINGS) ×2 IMPLANT
BENZOIN TINCTURE PRP APPL 2/3 (GAUZE/BANDAGES/DRESSINGS) IMPLANT
BIT DRILL 2 CANN GRADUATED (BIT) ×1 IMPLANT
BIT DRILL 2.5 CANN LNG (BIT) ×1 IMPLANT
BLADE SURG 15 STRL LF DISP TIS (BLADE) ×4 IMPLANT
BLADE SURG 15 STRL SS (BLADE) ×9
BNDG CMPR 9X6 STRL LF SNTH (GAUZE/BANDAGES/DRESSINGS) ×2
BNDG COHESIVE 4X5 TAN ST LF (GAUZE/BANDAGES/DRESSINGS) IMPLANT
BNDG CONFORM 3 STRL LF (GAUZE/BANDAGES/DRESSINGS) ×1 IMPLANT
BNDG ELASTIC 4X5.8 VLCR STR LF (GAUZE/BANDAGES/DRESSINGS) ×1 IMPLANT
BNDG ELASTIC 6X5.8 VLCR STR LF (GAUZE/BANDAGES/DRESSINGS) ×6 IMPLANT
BNDG ESMARK 6X9 LF (GAUZE/BANDAGES/DRESSINGS) ×3
CHLORAPREP W/TINT 26 (MISCELLANEOUS) ×4 IMPLANT
CNTNR URN SCR LID CUP LEK RST (MISCELLANEOUS) IMPLANT
CONT SPEC 4OZ STRL OR WHT (MISCELLANEOUS) ×3
COVER BACK TABLE 60X90IN (DRAPES) ×3 IMPLANT
CUFF TOURN SGL QUICK 34 (TOURNIQUET CUFF)
CUFF TRNQT CYL 34X4.125X (TOURNIQUET CUFF) ×2 IMPLANT
DRAPE C-ARM 42X72 X-RAY (DRAPES) ×3 IMPLANT
DRAPE C-ARMOR (DRAPES) ×3 IMPLANT
DRAPE EXTREMITY T 121X128X90 (DISPOSABLE) ×4 IMPLANT
DRAPE IMP U-DRAPE 54X76 (DRAPES) ×4 IMPLANT
DRAPE U-SHAPE 47X51 STRL (DRAPES) ×4 IMPLANT
ELECT REM PT RETURN 9FT ADLT (ELECTROSURGICAL) ×3
ELECTRODE REM PT RTRN 9FT ADLT (ELECTROSURGICAL) ×2 IMPLANT
GAUZE SPONGE 4X4 12PLY STRL (GAUZE/BANDAGES/DRESSINGS) ×3 IMPLANT
GAUZE XEROFORM 1X8 LF (GAUZE/BANDAGES/DRESSINGS) ×3 IMPLANT
GLOVE SRG 8 PF TXTR STRL LF DI (GLOVE) ×4 IMPLANT
GLOVE SURG ENC TEXT LTX SZ7.5 (GLOVE) ×6 IMPLANT
GLOVE SURG POLYISO LF SZ7 (GLOVE) ×1 IMPLANT
GLOVE SURG UNDER POLY LF SZ7 (GLOVE) ×2 IMPLANT
GLOVE SURG UNDER POLY LF SZ8 (GLOVE) ×6
GOWN STRL REUS W/ TWL LRG LVL3 (GOWN DISPOSABLE) ×2 IMPLANT
GOWN STRL REUS W/ TWL XL LVL3 (GOWN DISPOSABLE) ×4 IMPLANT
GOWN STRL REUS W/TWL LRG LVL3 (GOWN DISPOSABLE) ×3
GOWN STRL REUS W/TWL XL LVL3 (GOWN DISPOSABLE) ×6
NDL SAFETY ECLIPSE 18X1.5 (NEEDLE) IMPLANT
NEEDLE HYPO 18GX1.5 SHARP (NEEDLE) ×3
NEEDLE HYPO 22GX1.5 SAFETY (NEEDLE) ×1 IMPLANT
NS IRRIG 1000ML POUR BTL (IV SOLUTION) ×3 IMPLANT
PACK BASIN DAY SURGERY FS (CUSTOM PROCEDURE TRAY) ×3 IMPLANT
PAD CAST 4YDX4 CTTN HI CHSV (CAST SUPPLIES) ×2 IMPLANT
PADDING CAST COTTON 4X4 STRL (CAST SUPPLIES) ×6
PADDING CAST SYNTHETIC 4 (CAST SUPPLIES) ×2
PADDING CAST SYNTHETIC 4X4 STR (CAST SUPPLIES) ×4 IMPLANT
PENCIL SMOKE EVACUATOR (MISCELLANEOUS) ×3 IMPLANT
PLATE LOCK DIST FIB 5H TTNIUM (Plate) ×1 IMPLANT
SCREW CORT 3.5X18 THRD (Screw) ×1 IMPLANT
SCREW LOCK COMP 3X16 (Screw) ×3 IMPLANT
SCREW LP TI 3.5X14MM (Screw) ×2 IMPLANT
SCREW NL 3.5X52 (Screw) ×1 IMPLANT
SCREW VAL KREULOCK 3.0X14 TI (Screw) ×1 IMPLANT
SCREW VAL KREULOCK 3.0X18 TI (Screw) ×1 IMPLANT
SHEET MEDIUM DRAPE 40X70 STRL (DRAPES) ×3 IMPLANT
SLEEVE SCD COMPRESS KNEE MED (STOCKING) ×3 IMPLANT
SPIKE FLUID TRANSFER (MISCELLANEOUS) IMPLANT
SPLINT FAST PLASTER 5X30 (CAST SUPPLIES) ×20
SPLINT PLASTER CAST FAST 5X30 (CAST SUPPLIES) ×40 IMPLANT
SPONGE T-LAP 18X18 ~~LOC~~+RFID (SPONGE) ×3 IMPLANT
STAPLER VISISTAT 35W (STAPLE) ×1 IMPLANT
STOCKINETTE 4X48 STRL (DRAPES) ×1 IMPLANT
STOCKINETTE 6  STRL (DRAPES) ×2
STOCKINETTE 6 STRL (DRAPES) ×2 IMPLANT
STRIP CLOSURE SKIN 1/2X4 (GAUZE/BANDAGES/DRESSINGS) IMPLANT
SUCTION FRAZIER HANDLE 10FR (MISCELLANEOUS) ×1
SUCTION TUBE FRAZIER 10FR DISP (MISCELLANEOUS) ×2 IMPLANT
SUT ETHILON 3 0 PS 1 (SUTURE) ×3 IMPLANT
SUT MNCRL AB 3-0 PS2 18 (SUTURE) ×4 IMPLANT
SUT PDS AB 2-0 CT2 27 (SUTURE) ×3 IMPLANT
SUT VIC AB 2-0 SH 27 (SUTURE) ×3
SUT VIC AB 2-0 SH 27XBRD (SUTURE) IMPLANT
SUT VIC AB 3-0 FS2 27 (SUTURE) IMPLANT
SYR 20ML LL LF (SYRINGE) ×1 IMPLANT
SYR BULB EAR ULCER 3OZ GRN STR (SYRINGE) ×3 IMPLANT
SYR CONTROL 10ML LL (SYRINGE) ×1 IMPLANT
TOWEL GREEN STERILE FF (TOWEL DISPOSABLE) ×6 IMPLANT
TUBE CONNECTING 20X1/4 (TUBING) ×3 IMPLANT
UNDERPAD 30X36 HEAVY ABSORB (UNDERPADS AND DIAPERS) ×3 IMPLANT

## 2021-02-13 NOTE — Transfer of Care (Signed)
Immediate Anesthesia Transfer of Care Note  Patient: Peggy Harris  Procedure(s) Performed: OPEN TREATMENT OF LEFT LATERAL MALLEOLUS WITH SYNDESMOSIS (Left: Ankle) BIOPSY RIGHT FOOT MASS (Right: Foot)  Patient Location: PACU  Anesthesia Type:GA combined with regional for post-op pain  Level of Consciousness: awake, alert , oriented and patient cooperative  Airway & Oxygen Therapy: Patient Spontanous Breathing and Patient connected to face mask oxygen  Post-op Assessment: Report given to RN and Post -op Vital signs reviewed and stable  Post vital signs: Reviewed and stable  Last Vitals:  Vitals Value Taken Time  BP 125/81 02/13/21 1103  Temp    Pulse 82 02/13/21 1107  Resp 12 02/13/21 1107  SpO2 93 % 02/13/21 1107  Vitals shown include unvalidated device data.  Last Pain:  Vitals:   02/13/21 0708  TempSrc: Oral  PainSc: 8       Patients Stated Pain Goal: 4 (93/71/69 6789)  Complications: No notable events documented.

## 2021-02-13 NOTE — Discharge Instructions (Addendum)
DR. Lucia Gaskins FOOT & ANKLE SURGERY POST-OP INSTRUCTIONS   Pain Management The numbing medicine and your leg will last around 18 hours, take a dose of your pain medicine as soon as you feel it wearing off to avoid rebound pain. Keep your foot elevated above heart level.  Make sure that your heel hangs free ('floats'). Take all prescribed medication as directed. If taking narcotic pain medication you may want to use an over-the-counter stool softener to avoid constipation. You may take over-the-counter NSAIDs (ibuprofen, naproxen, etc.) as well as over-the-counter acetaminophen as directed on the packaging as a supplement for your pain and may also use it to wean away from the prescription medication.  Activity Non-weightbearing Keep splint intact  First Postoperative Visit Your first postop visit will be at least 2 weeks after surgery.  This should be scheduled when you schedule surgery. If you do not have a postoperative visit scheduled please call [phone] to schedule an appointment. At the appointment your incision will be evaluated for suture removal, x-rays will be obtained if necessary.  General Instructions Swelling is very common after foot and ankle surgery.  It often takes 3 months for the foot and ankle to begin to feel comfortable.  Some amount of swelling will persist for 6-12 months. DO NOT change the dressing.  If there is a problem with the dressing (too tight, loose, gets wet, etc.) please contact Dr. Pollie Friar office. DO NOT get the dressing wet.  For showers you can use an over-the-counter cast cover or wrap a washcloth around the top of your dressing and then cover it with a plastic bag and tape it to your leg. DO NOT soak the incision (no tubs, pools, bath, etc.) until you have approval from Dr. Lucia Gaskins.  Contact Dr. Huel Cote office or go to Emergency Room if: Temperature above 101 F. Increasing pain that is unresponsive to pain medication or elevation Excessive redness or  swelling in your foot Dressing problems - excessive bloody drainage, looseness or tightness, or if dressing gets wet Develop pain, swelling, warmth, or discoloration of your calf    Post Anesthesia Home Care Instructions  Activity: Get plenty of rest for the remainder of the day. A responsible individual must stay with you for 24 hours following the procedure.  For the next 24 hours, DO NOT: -Drive a car -Paediatric nurse -Drink alcoholic beverages -Take any medication unless instructed by your physician -Make any legal decisions or sign important papers.  Meals: Start with liquid foods such as gelatin or soup. Progress to regular foods as tolerated. Avoid greasy, spicy, heavy foods. If nausea and/or vomiting occur, drink only clear liquids until the nausea and/or vomiting subsides. Call your physician if vomiting continues.  Special Instructions/Symptoms: Your throat may feel dry or sore from the anesthesia or the breathing tube placed in your throat during surgery. If this causes discomfort, gargle with warm salt water. The discomfort should disappear within 24 hours.  If you had a scopolamine patch placed behind your ear for the management of post- operative nausea and/or vomiting:  1. The medication in the patch is effective for 72 hours, after which it should be removed.  Wrap patch in a tissue and discard in the trash. Wash hands thoroughly with soap and water. 2. You may remove the patch earlier than 72 hours if you experience unpleasant side effects which may include dry mouth, dizziness or visual disturbances. 3. Avoid touching the patch. Wash your hands with soap and water after contact with  the patch.    Regional Anesthesia Blocks  1. Numbness or the inability to move the "blocked" extremity may last from 3-48 hours after placement. The length of time depends on the medication injected and your individual response to the medication. If the numbness is not going away after 48  hours, call your surgeon.  2. The extremity that is blocked will need to be protected until the numbness is gone and the  Strength has returned. Because you cannot feel it, you will need to take extra care to avoid injury. Because it may be weak, you may have difficulty moving it or using it. You may not know what position it is in without looking at it while the block is in effect.  3. For blocks in the legs and feet, returning to weight bearing and walking needs to be done carefully. You will need to wait until the numbness is entirely gone and the strength has returned. You should be able to move your leg and foot normally before you try and bear weight or walk. You will need someone to be with you when you first try to ensure you do not fall and possibly risk injury.  4. Bruising and tenderness at the needle site are common side effects and will resolve in a few days.  5. Persistent numbness or new problems with movement should be communicated to the surgeon or the [facility] [phone] [place] [phone]).    No tylenol until after 1:45pm today, if needed. No ibuprofen/motrin until after 6:45pm today, if needed.

## 2021-02-13 NOTE — Anesthesia Postprocedure Evaluation (Signed)
Anesthesia Post Note  Patient: Peggy Harris  Procedure(s) Performed: OPEN TREATMENT OF LEFT LATERAL MALLEOLUS WITH SYNDESMOSIS (Left: Ankle) BIOPSY RIGHT FOOT MASS (Right: Foot)     Patient location during evaluation: PACU Anesthesia Type: General and Regional Level of consciousness: awake Pain management: pain level controlled Vital Signs Assessment: post-procedure vital signs reviewed and stable Respiratory status: spontaneous breathing and respiratory function stable Cardiovascular status: stable Postop Assessment: no apparent nausea or vomiting Anesthetic complications: no   No notable events documented.  Last Vitals:  Vitals:   02/13/21 1130 02/13/21 1142  BP: 113/72   Pulse: 70 69  Resp: 11 20  Temp:    SpO2: 98% 96%    Last Pain:  Vitals:   02/13/21 1130  TempSrc:   PainSc: 2                  Merlinda Frederick

## 2021-02-13 NOTE — Anesthesia Procedure Notes (Signed)
Anesthesia Regional Block: Popliteal block   Pre-Anesthetic Checklist: , timeout performed,  Correct Patient, Correct Site, Correct Laterality,  Correct Procedure, Correct Position, site marked,  Risks and benefits discussed,  Surgical consent,  Pre-op evaluation,  At surgeon's request and post-op pain management  Laterality: Left  Prep: chloraprep       Needles:  Injection technique: Single-shot  Needle Type: Echogenic Stimulator Needle     Needle Length: 10cm  Needle Gauge: 20     Additional Needles:   Procedures:,,,, ultrasound used (permanent image in chart),,    Narrative:  Start time: 02/13/2021 7:20 AM End time: 02/13/2021 7:25 AM Injection made incrementally with aspirations every 5 mL.  Performed by: Personally  Anesthesiologist: Merlinda Frederick, MD  Additional Notes: A functioning IV was confirmed and monitors were applied.  Sterile prep and drape, hand hygiene and sterile gloves were used.  Negative aspiration and test dose prior to incremental administration of local anesthetic. The patient tolerated the procedure well.Ultrasound  guidance: relevant anatomy identified, needle position confirmed, local anesthetic spread visualized around nerve(s), vascular puncture avoided.  Image printed for medical record.

## 2021-02-13 NOTE — H&P (Addendum)
PREOPERATIVE H&P  Chief Complaint: Left ankle pain  HPI: Peggy Harris is a 41 y.o. female who presents for preoperative history and physical with a diagnosis of left lateral malleolus fracture, displaced with possible syndesmotic disruption. Symptoms are rated as moderate to severe, and have been worsening.  This is significantly impairing activities of daily living.  She has elected for surgical management.   Patient also has remote history of right metatarsal fracture with some concern for cellulitis development.  She has significant scar and had an MRI scan report done yesterday due to some evidence of apparent bony resorption.  She denies pain in her foot.  She is no longer on antibiotics.  Past Medical History:  Diagnosis Date   Anxiety    Arthritis    Cerebral palsy ([place])    walker for ambulation   DDD (degenerative disc disease), lumbar    Depression    GERD (gastroesophageal reflux disease)    IBS (irritable bowel syndrome)    Insomnia disorder 07/28/2019   Migraine    Status post dilation of esophageal narrowing    Past Surgical History:  Procedure Laterality Date   ABDOMINAL HYSTERECTOMY  10/2018   BACK SURGERY  06/16/2020   L4-5 TLIF Revision at [facility]     x2 - 2006, 2014   CORNEAL TRANSPLANT     2009, 2010   LAPAROSCOPIC OOPHERECTOMY Right 2020   LUMBAR FUSION     X2 2007 and 2019 L4-L5   REPAIR EXTENSOR TENDON WITH METATARSAL OSTEOTOMY AND OPEN REDUCTION IN Right 11/02/2020   Procedure: OPEN TREATMENT OF RIGHT SECOND AND THIRD METATARSAL FRACTURE AND SURGERY AS INDICATED;  Surgeon: Erle Crocker, MD;  Location: [facility];  Service: Orthopedics;  Laterality: Right;   Social History   Socioeconomic History   Marital status: Married    Spouse name: Not on file   Number of children: 3   Years of education: Not on file   Highest education level: Not on file  Occupational History   Not on file  Tobacco Use   Smoking status:  Never   Smokeless tobacco: Never  Vaping Use   Vaping Use: Every day   Substances: Nicotine, Flavoring  Substance and Sexual Activity   Alcohol use: Yes    Alcohol/week: 14.0 standard drinks    Types: 14 Standard drinks or equivalent per week    Comment: 2 per day wine/liquor   Drug use: Never   Sexual activity: Yes    Birth control/protection: Surgical    Comment: Hysterectomy  Other Topics Concern   Not on file  Social History Narrative   Not on file   Social Determinants of Health   Financial Resource Strain: Not on file  Food Insecurity: Not on file  Transportation Needs: Not on file  Physical Activity: Not on file  Stress: Not on file  Social Connections: Not on file   Family History  Problem Relation Age of Onset   Congestive Heart Failure Mother    Stroke Mother    Skin cancer Mother    Clotting disorder Mother    Leukemia Father    Alzheimer's disease Father    Breast cancer Maternal Aunt    Allergies  Allergen Reactions   Shellfish Allergy Rash   Ambien [Zolpidem] Other (See Comments)    (Ambien) insomnia Heart palpitations  (Ambien) insomnia Heart palpitations     Duloxetine Other (See Comments)    Increased depression (Cymbalta)    Paxil [  Paroxetine Hcl]     Other reaction(s): Other (See Comments) (Paxil) Heart palpitations    Quetiapine Other (See Comments) and Palpitations    nightmares   Remeron [Mirtazapine] Palpitations    Other reaction(s): Other (See Comments) Heart Palpitations, drowsy Heart Palpitations, drowsy    Trazodone Palpitations   Prior to Admission medications   Medication Sig Start Date End Date Taking? Authorizing Provider  aspirin (BAYER ASPIRIN) 325 MG tablet Take 1 tablet (325 mg total) by mouth daily. 02/13/21 03/15/21 Yes Martinique, Jesse J, PA-C  escitalopram (LEXAPRO) 10 MG tablet Take 10 mg by mouth daily.   Yes [provider]  gabapentin (NEURONTIN) 300 MG capsule Take 300-600 mg by mouth 3 (three) times  daily as needed (nerve pain.).   Yes [provider]  hydrochlorothiazide (HYDRODIURIL) 25 MG tablet Take 25 mg by mouth daily as needed (fluid retention).   Yes [provider]  ibuprofen (ADVIL) 200 MG tablet Take 800 mg by mouth every 8 (eight) hours as needed (for pain.).   Yes [provider]  methocarbamol (ROBAXIN) 750 MG tablet Take 750 mg by mouth 3 (three) times daily as needed for muscle spasms.   Yes [provider]  oxyCODONE (ROXICODONE) 5 MG immediate release tablet Take 1 tablet (5 mg total) by mouth every 4 (four) hours as needed for up to 5 days for severe pain or breakthrough pain. 02/13/21 02/18/21 Yes Martinique, Jesse J, PA-C  oxyCODONE-acetaminophen (PERCOCET) 10-325 MG tablet Take 1 tablet by mouth every 4 (four) hours as needed for pain.   Yes [provider]  promethazine (PHENERGAN) 12.5 MG tablet Take 12.5-25 mg by mouth 3 (three) times daily as needed for nausea/vomiting. 09/29/20  Yes [provider]  esomeprazole (NEXIUM) 40 MG capsule Take 40 mg by mouth daily as needed (indigestion/heartburn.).    [provider]  famotidine (PEPCID) 40 MG tablet Take 40 mg by mouth at bedtime as needed for nausea. 10/25/20   [provider]  rizatriptan (MAXALT-MLT) 10 MG disintegrating tablet Take 10 mg by mouth daily as needed for migraine. 10/06/20   [provider]     Positive ROS: All other systems have been reviewed and were otherwise negative with the exception of those mentioned in the HPI and as above.  Physical Exam:  Vitals:   02/13/21 0730 02/13/21 0735  BP: (!) 102/56 108/78  Pulse: 75 72  Resp: 18 16  Temp:    SpO2: 100% 99%   General: Alert, no acute distress Cardiovascular: No pedal edema Respiratory: No cyanosis, no use of accessory musculature GI: No organomegaly, abdomen is soft and non-tender Skin: No lesions in the area of chief complaint Neurologic: Sensation intact  distally Psychiatric: Patient is competent for consent with normal mood and affect Lymphatic: No axillary or cervical lymphadenopathy  MUSCULOSKELETAL: Left ankle with tenderness palpation along the lateral ankle.  Clinically the ankle is well aligned.  No dorsal forefoot pain.  No dorsal midfoot pain.  Did not assess stability and range of motion.  Sensation grossly intact distally.  Foot is warm and well-perfused.  Assessment: Left lateral malleolus fracture with possible syndesmosis disruption   Plan: Plan for open treatment of her lateral malleolus and assessment of stability.  Possible open treatment of syndesmosis based on intraoperative findings.  We also reviewed her MRI scan.  There is a large area of either fluid or scar tissue formation of the dorsal aspect of her foot near the level of the second metatarsal.  This may indicate subacute or underlying infection.  We discussed taking a small biopsy of this tissue intended to culture to be sure its not an infection.  She is in agreement with this plan.  We discussed the risks, benefits and alternatives of surgery which include but are not limited to wound healing complications, infection, nonunion, malunion, need for further surgery, damage to surrounding structures and continued pain.  They understand there is no guarantees to an acceptable outcome.  After weighing these risks they opted to proceed with surgery.     Erle Crocker, MD    02/13/2021 9:15 AM

## 2021-02-13 NOTE — Anesthesia Preprocedure Evaluation (Addendum)
Anesthesia Evaluation  Patient identified by MRN, date of birth, ID band Patient awake    Reviewed: Allergy & Precautions, H&P , NPO status , Patient's Chart, lab work & pertinent test results  Airway Mallampati: II   Neck ROM: full    Dental   Pulmonary Patient abstained from smoking.,    breath sounds clear to auscultation       Cardiovascular negative cardio ROS   Rhythm:regular Rate:Normal     Neuro/Psych  Headaches, PSYCHIATRIC DISORDERS Anxiety Depression Cerebral palsy    GI/Hepatic GERD  Medicated and Controlled,  Endo/Other    Renal/GU      Musculoskeletal  (+) Arthritis ,   Abdominal   Peds  Hematology   Anesthesia Other Findings   Reproductive/Obstetrics                            Anesthesia Physical Anesthesia Plan  ASA: 2  Anesthesia Plan: General and Regional   Post-op Pain Management: Tylenol PO (pre-op)   Induction: Intravenous  PONV Risk Score and Plan: Ondansetron, Dexamethasone, Midazolam, Treatment may vary due to age or medical condition and Scopolamine patch - Pre-op  Airway Management Planned: LMA  Additional Equipment: None  Intra-op Plan:   Post-operative Plan: Extubation in OR  Informed Consent: I have reviewed the patients History and Physical, chart, labs and discussed the procedure including the risks, benefits and alternatives for the proposed anesthesia with the patient or authorized representative who has indicated his/her understanding and acceptance.     Dental advisory given  Plan Discussed with: CRNA, Anesthesiologist and Surgeon  Anesthesia Plan Comments: (Popliteal block for pain control. GA/LMA. Norton Blizzard, MD  )       Anesthesia Quick Evaluation

## 2021-02-13 NOTE — Anesthesia Procedure Notes (Signed)
Procedure Name: LMA Insertion Date/Time: 02/13/2021 9:42 AM Performed by: Glory Buff, CRNA Pre-anesthesia Checklist: Patient identified, Emergency Drugs available, Suction available and Patient being monitored Patient Re-evaluated:Patient Re-evaluated prior to induction Oxygen Delivery Method: Circle system utilized Preoxygenation: Pre-oxygenation with 100% oxygen Induction Type: IV induction LMA: LMA inserted LMA Size: 4.0 Number of attempts: 1 Placement Confirmation: positive ETCO2 Tube secured with: Tape Dental Injury: Teeth and Oropharynx as per pre-operative assessment

## 2021-02-13 NOTE — Progress Notes (Signed)
Assisted Dr. Elgie Congo with left, ultrasound guided, popliteal block. Side rails up, monitors on throughout procedure. See vital signs in flow sheet. Tolerated Procedure well.

## 2021-02-14 ENCOUNTER — Encounter (HOSPITAL_BASED_OUTPATIENT_CLINIC_OR_DEPARTMENT_OTHER): Payer: Self-pay | Admitting: Orthopaedic Surgery

## 2021-02-14 LAB — SURGICAL PATHOLOGY

## 2021-02-14 NOTE — Op Note (Addendum)
Peggy Harris female 41 y.o. 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   ANESTHESIA: neural element with a left-sided peripheral nerve blockade, right-sided local infiltration of half percent Marcaine plain  FINDINGS: See below  IMPLANTS: Arthrex distal fibular locking plate with fully threaded screw for syndesmosis  INDICATIONS:41 y.o. femalesustained a left ankle fracture after a fall at home.  She had displacement of the fracture and medial clear space widening.  She was indicated for surgery [place] on these findings.  She recently had open treatment of metatarsal fractures on the right side and there was concerning features on x-ray and MRI scan of some soft tissue mass and resorbed bone surrounding the fracture site that did not appear to be normal fracture callus.  Based on this she was indicated for tresection of this area of the metatarsal and sending of the material for culture and pathology to ensure it is not subclinical infection.   Patient understood the risks, benefits and alternatives to surgery which include but are not limited to wound healing complications, infection, nonunion, malunion, need for further surgery as well as damage to surrounding structures. They also understood the potential for continued pain in that there were no guarantees of acceptable outcome After weighing these risks the patient opted to  proceed with surgery.  PROCEDURE: Patient was identified the preoperative holding area.  The left and right leg was marked myself.  Consent was signed myself and the patient.  Peripheral nerve block was performed by anesthesia.  Patient was taken to the operative suite and placed supine on the operative table.  General anesthesia was induced out difficulty.  Preoperative antibiotics were given.  Thigh tourniquet was placed on the operative thigh and a bump was placed under the hip. Bone foam was used.  All bony prominences well-padded. The right and left lower extremity were prepped and draped in usual sterile fashion. Surgical timeout was performed.  The operative extremity was then elevated and the tourniquet inflated to 250 mmHg.    ORIF Lateral malleolus: We began by making a longitudinal incision overlying the distal fibulathe left leg.  This was taken sharply down through skin and subcutaneous tissue.  Blunt dissection was used to identify any branch of the superficial peroneal nerve which was was identified and dissected out and protected through the entire the case..  The incision was then taken sharply down to bone and the fracture site was identified.  The fracture site was mobilized.   The fracture site were cleaned with a rondure and curette of any fracture hematoma and callus formation.  Then the fracture of the fibula was reduced under direct visualization and held provisionally with a lobster claw.  Then fluoroscopy confirmed adequate reduction of the fibula at that time.  Then a combination of locking and nonlocking screws were used after placement of a lag screw across the fracture by technique.  This provided good stability of the distal fibula fracture.     Ankle stress view flouroscopy: Then under fluoroscopy the ankle was stressed using an anterior drawer, dorsiflex and external rotation  moment and found unstable with regard to the syndesmosis.  Open treatment of  syndesmosis: Separate deep incision was created along the anterior aspect of the distal fibula to gain access to the syndesmosis.  Syndesmosis was identified and a Valora Corporal was placed within the area of the ligament and these were found to be disrupted and it was unstable.  Then using a rongeur the area of the syndesmosis was cleared of interposed tissue.  The syndesmosis was then reduced under direct visualization and held provisionally with a Weber clamp.  Then a single full threaded screw was placed across the syndesmosis through the fibular plate.  There was good maintenance of reduction.  The syndesmosis was stable after placement of the screw.  This was confirmed on fluoroscopy.  Final fluoroscopic images were obtained.   She will resection of second metatarsal: We then turned our attention to the right foot.  The area of concern was identified through palpation.  We attempted to aspirate using an 18-gauge needle but there is no fluid withdrawn.  Then a small incision was made overlying the deep soft tissue massand hypertrophic bone of the second metatarsal.  This was medial to the second metatarsal at the fracture site.  This was taken sharply down through skin and subcutaneous tissue.  The fibrous type soft tissue mass was identified and the callus and resorb type second metatarsal bone was resected using a rongeur.  This bony and soft tissue material was deep to the extensor tendons and fascia.  The resected material was sent for pathology and culture.  There was no gross signs of infection.  Wound was then irrigated with normal saline.  The wounds were irrigated and the deep tissue was closed with a 3-0 nmonocryl  The subcuticular tissue was closed with 3-0 Monocryl and the skin with staples.  Xeroform placed on the wounds as well as 4 x 4's and sterile she cotton.  She tolerated this well.  There were no complications.  She was awakened from anesthesia and taken recovery in stable condition.  POST  OPERATIVE INSTRUCTIONS: Nonweightbearing on left leg and WBAT on right operative extremity Keep splint dry and limb elevated Call the office with concerns Follow-up in 2 weeks for splint removal, x-rays of the operative ankle, nonweightbearing and suture removal if appropriate.   She will be placed into a walking boot.   TOURNIQUET TIME: Less than 1 hour  BLOOD LOSS:  Minimal         DRAINS: none         SPECIMEN: none       COMPLICATIONS:  * No complications entered in OR log *         Disposition: PACU - hemodynamically stable.         Condition: stable

## 2021-02-18 LAB — AEROBIC/ANAEROBIC CULTURE W GRAM STAIN (SURGICAL/DEEP WOUND)
Culture: NO GROWTH
Gram Stain: NONE SEEN

## 2022-07-13 IMAGING — XA DG ANKLE COMPLETE 3+V*L*
1 series · 4 of 4 positions shown · non-contrast
Comparison: 01/25/2021

CLINICAL DATA: Left ankle ORIF

EXAM:
LEFT ANKLE COMPLETE - 3+ VIEW

[Series 1: unknown protocol · 0.30mm/px · 4 of 4 slices shown]
[im 1/4]
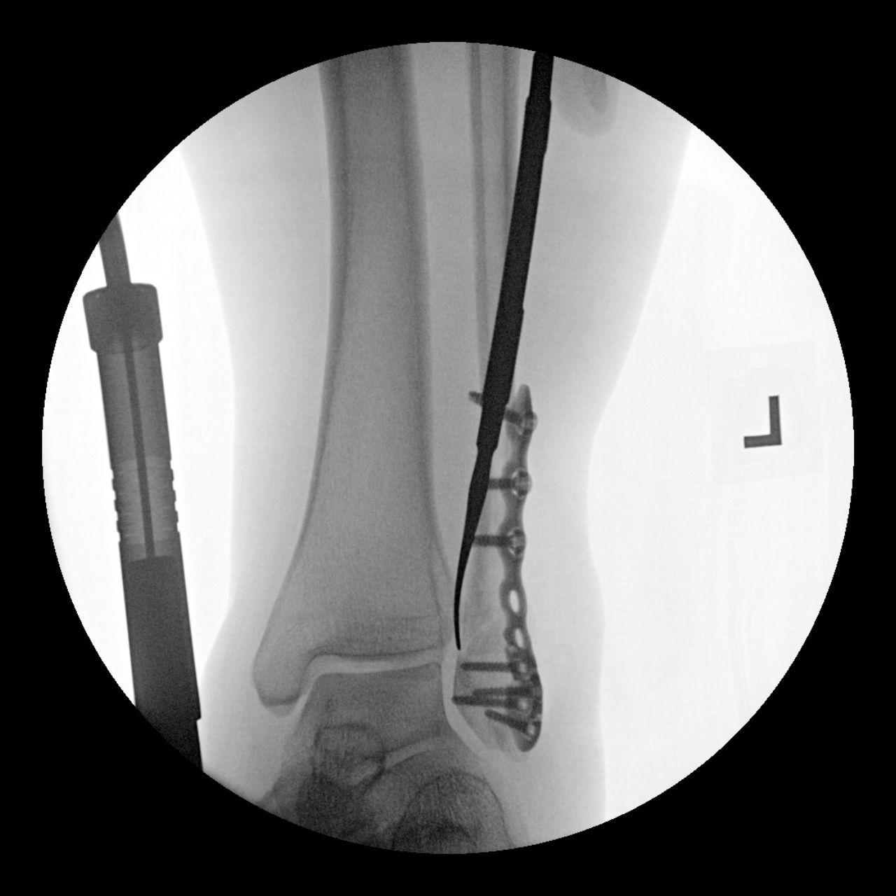
[im 2/4]
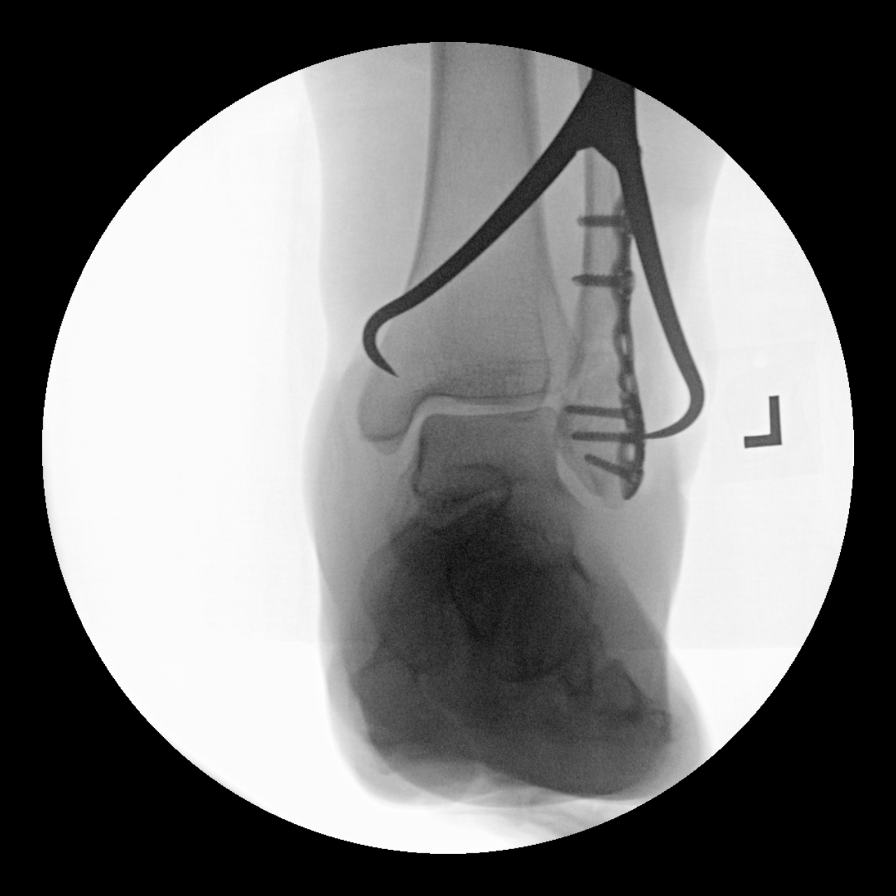
[im 3/4]
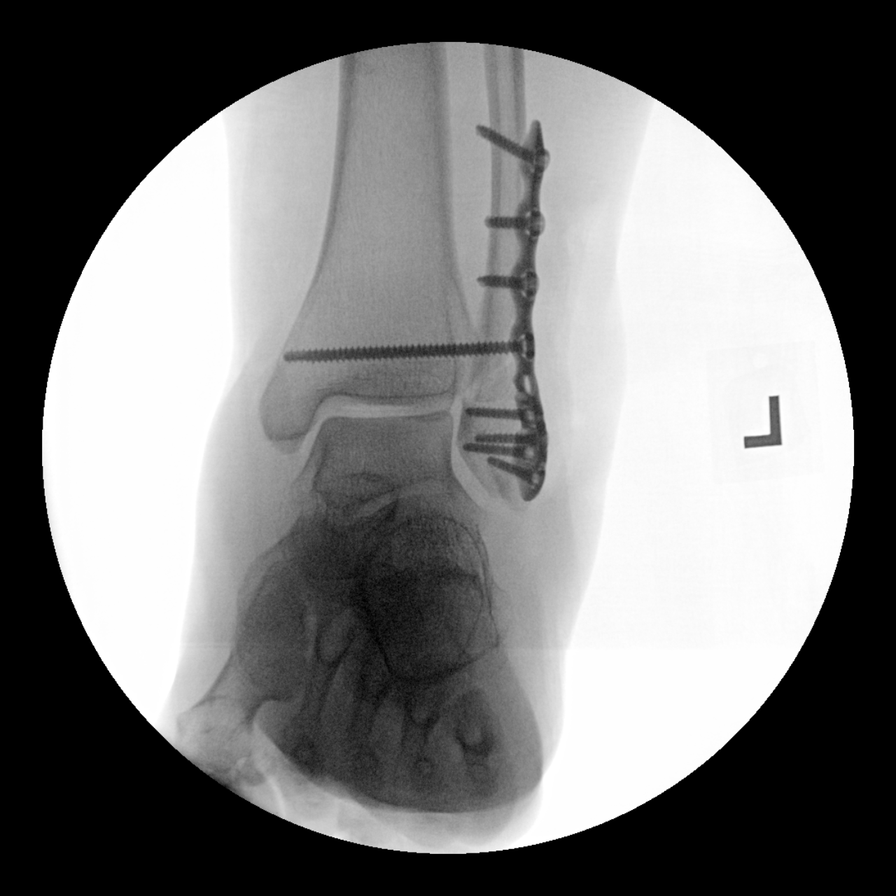
[im 4/4]
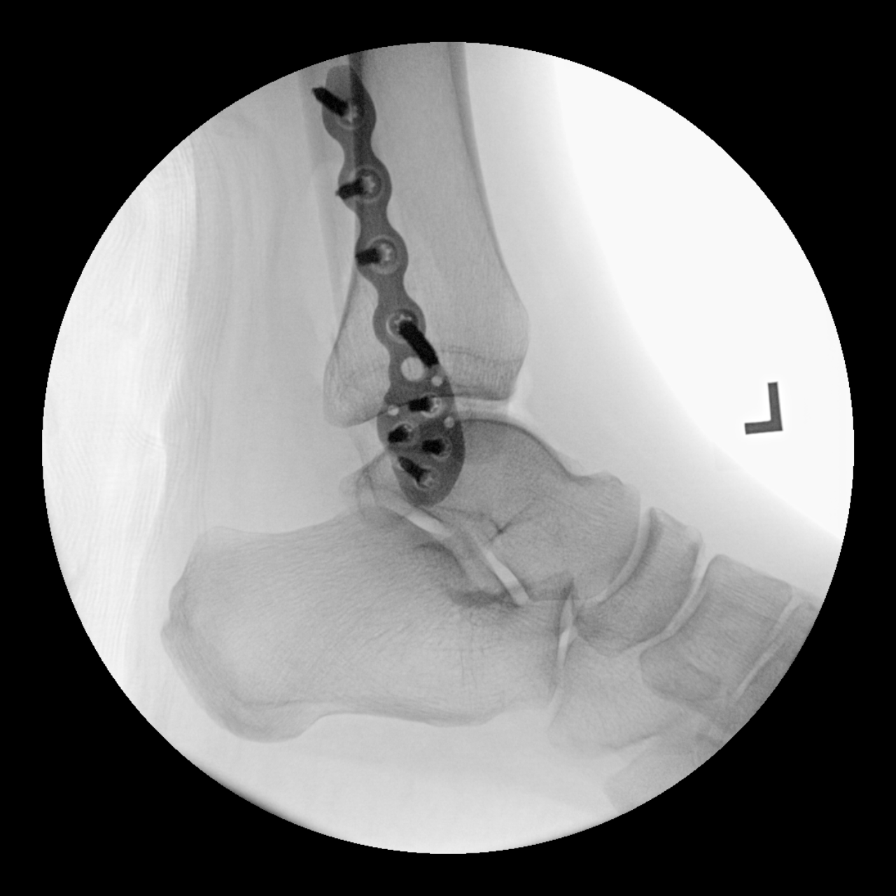

[4 of 4 positions shown; findings below may reference images not displayed]

FINDINGS: Four fluoroscopic intraoperative radiographs demonstrate ORIF of a
distal left fibular fracture with fracture fragments in near
anatomic alignment. Final image demonstrates placement of a
transsyndesmotic screw with near anatomic alignment of the ankle
mortise on this limited examination. No unexpected fracture or
dislocation.

Fluoroscopy time: 31.7 seconds

Fluoroscopic dose: 1.3808 mGy

Images: 4
IMPRESSION: Left ankle ORIF as described above.
